# Patient Record
Sex: Female | Born: 1980 | Race: White | Hispanic: No | Marital: Married | Smoking: Never smoker
Health system: Southern US, Community
[De-identification: ages and names within clinical notes are randomized; demographics above are authoritative.]

## PROBLEM LIST (undated history)

## (undated) DIAGNOSIS — K219 Gastro-esophageal reflux disease without esophagitis: Secondary | ICD-10-CM

## (undated) DIAGNOSIS — IMO0001 Reserved for inherently not codable concepts without codable children: Secondary | ICD-10-CM

## (undated) HISTORY — PX: OTHER SURGICAL HISTORY: SHX169

---
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
## 2002-05-13 ENCOUNTER — Encounter: Payer: Self-pay | Admitting: Emergency Medicine

## 2002-05-13 ENCOUNTER — Emergency Department (HOSPITAL_COMMUNITY): Admission: EM | Admit: 2002-05-13 | Discharge: 2002-05-13 | Payer: Self-pay | Admitting: Emergency Medicine

## 2002-06-14 ENCOUNTER — Emergency Department (HOSPITAL_COMMUNITY): Admission: EM | Admit: 2002-06-14 | Discharge: 2002-06-14 | Payer: Self-pay | Admitting: Emergency Medicine

## 2003-04-18 ENCOUNTER — Emergency Department (HOSPITAL_COMMUNITY): Admission: EM | Admit: 2003-04-18 | Discharge: 2003-04-18 | Payer: Self-pay | Admitting: Emergency Medicine

## 2006-05-26 ENCOUNTER — Emergency Department (HOSPITAL_COMMUNITY): Admission: EM | Admit: 2006-05-26 | Discharge: 2006-05-26 | Payer: Self-pay | Admitting: Emergency Medicine

## 2006-05-28 ENCOUNTER — Emergency Department (HOSPITAL_COMMUNITY): Admission: EM | Admit: 2006-05-28 | Discharge: 2006-05-28 | Payer: Self-pay | Admitting: Emergency Medicine

## 2007-11-26 ENCOUNTER — Other Ambulatory Visit: Admission: RE | Admit: 2007-11-26 | Discharge: 2007-11-26 | Payer: Self-pay | Admitting: Obstetrics and Gynecology

## 2008-04-17 ENCOUNTER — Emergency Department (HOSPITAL_COMMUNITY): Admission: EM | Admit: 2008-04-17 | Discharge: 2008-04-17 | Payer: Self-pay | Admitting: Emergency Medicine

## 2008-09-22 ENCOUNTER — Emergency Department (HOSPITAL_COMMUNITY): Admission: EM | Admit: 2008-09-22 | Discharge: 2008-09-22 | Payer: Self-pay | Admitting: Emergency Medicine

## 2008-12-24 ENCOUNTER — Emergency Department (HOSPITAL_COMMUNITY): Admission: EM | Admit: 2008-12-24 | Discharge: 2008-12-24 | Payer: Self-pay | Admitting: Emergency Medicine

## 2009-12-21 ENCOUNTER — Ambulatory Visit: Payer: Self-pay | Admitting: Orthopedic Surgery

## 2009-12-21 DIAGNOSIS — D492 Neoplasm of unspecified behavior of bone, soft tissue, and skin: Secondary | ICD-10-CM

## 2009-12-22 ENCOUNTER — Encounter (INDEPENDENT_AMBULATORY_CARE_PROVIDER_SITE_OTHER): Payer: Self-pay | Admitting: *Deleted

## 2009-12-23 ENCOUNTER — Encounter: Payer: Self-pay | Admitting: Orthopedic Surgery

## 2010-01-17 ENCOUNTER — Telehealth: Payer: Self-pay | Admitting: Orthopedic Surgery

## 2010-01-20 ENCOUNTER — Encounter: Payer: Self-pay | Admitting: Orthopedic Surgery

## 2010-02-06 ENCOUNTER — Ambulatory Visit: Payer: Self-pay | Admitting: Orthopedic Surgery

## 2010-02-07 ENCOUNTER — Telehealth: Payer: Self-pay | Admitting: Orthopedic Surgery

## 2010-03-02 ENCOUNTER — Telehealth: Payer: Self-pay | Admitting: Orthopedic Surgery

## 2010-03-06 ENCOUNTER — Encounter: Payer: Self-pay | Admitting: Orthopedic Surgery

## 2010-05-07 ENCOUNTER — Encounter: Payer: Self-pay | Admitting: Orthopedic Surgery

## 2010-05-16 NOTE — Progress Notes (Signed)
Summary: MRI appointment changed.  Phone Note Outgoing Call   Call placed by: Waldon Reining,  January 17, 2010 9:57 AM Call placed to: Patient Action Taken: Appt scheduled Summary of Call: I called to let the patient know that I got her MRI approved at [facility]. They will call her to set up the appointment. She is to get a copy of her films to bring to Dr. Romeo Apple.

## 2010-05-16 NOTE — Assessment & Plan Note (Signed)
Summary: CONSULT/TREAT RT HAND CYST/NEED XRAY/CA MEDICAI/REF [place]/CAF   Vital Signs:  Patient profile:   30 year old female Height:      65 inches Weight:      242 pounds Pulse rate:   70 / minute Resp:     16 per minute  Vitals Entered By: Fuller Canada MD (December 21, 2009 3:47 PM)  Visit Type:  Initial Consult Referring Provider:  Dr. Sudie Bailey Primary Provider:  Dr. Waunita Sandstrom Giovanni  CC:  right hand.  History of Present Illness: I saw Sydney Stevens in the office today for an initial visit.  She is a 30 years old woman with the complaint of:  right hand cyst.  Xrays today.  Meds: Ventolin, Azmacort, Omeprazole.  This patient presents with a history of a cyst on the dorsum of her RIGHT hand for the last 6-12 months.  It is causing some aching in her hand and in her fingers on the RIGHT side.  Her pain seems to come and go she is currently not having any functional deficits.  She denies any injury.  No treatment up to this point.    Allergies (verified): No Known Drug Allergies  Past History:  Past Medical History: asthma acid reflux  Past Surgical History: DNC mouth cysts  Family History: FH of Cancer:  Family History of Arthritis Hx, family, asthma  Social History: Patient is married.  unemployed no smoking no alcohol 2 glasses per day of caffeine GED  Review of Systems Constitutional:  Denies weight loss, weight gain, fever, chills, and fatigue. Cardiovascular:  Complains of palpitations; denies chest pain, fainting, and murmurs. Respiratory:  Denies short of breath, wheezing, couch, tightness, pain on inspiration, and snoring . Gastrointestinal:  Complains of heartburn; denies nausea, vomiting, diarrhea, constipation, and blood in your stools. Genitourinary:  Denies frequency, urgency, difficulty urinating, painful urination, flank pain, and bleeding in urine. Neurologic:  Denies numbness, tingling, unsteady gait, dizziness, tremors, and  seizure. Musculoskeletal:  Denies joint pain, swelling, instability, stiffness, redness, heat, and muscle pain. Endocrine:  Denies excessive thirst, exessive urination, and heat or cold intolerance. Psychiatric:  Complains of anxiety; denies nervousness, depression, and hallucinations. Skin:  Denies changes in the skin, poor healing, rash, itching, and redness. HEENT:  Denies blurred or double vision, eye pain, redness, and watering. Immunology:  Denies seasonal allergies, sinus problems, and allergic to bee stings. Hemoatologic:  Denies easy bleeding and brusing.  Physical Exam  Additional Exam:  vital signs are normal orientation x3 normal mood normal inspection reveals mild tenderness over the palmar aspect of the hand with a small 2 x 2 centimeter soft subcutaneous mass which is mobile under the skin.  Range of motion is the hand is normal stability of the wrist is normal the skin overlying the lesion is normal the perfusion of the digit is normal there is no lymphadenopathy in the extremity   Impression & Recommendations:  Problem # 1:  NEOPLASMS UNSPEC NATURE BONE SOFT TISSUE&SKIN (ICD-239.2) Assessment New  radiographs include AP lateral oblique of the RIGHT hand  No bony abnormalities seen the lamina the bone is normal and the joint spaces are normal  Normal x-ray of the hand  This is an unknown mass present for over 6 months recommend an MRI to delineate the mass for diagnosis and treatment purposes  Orders: New Patient Level III (64332) Hand x-ray, minimum 3 views (73130)  Patient Instructions: 1)  MRI HAND  2)  RETURN FOR RESULTS

## 2010-05-16 NOTE — Letter (Signed)
Summary: History form  History form   Imported By: Jacklynn Ganong 12/22/2009 16:08:16  _____________________________________________________________________  External Attachment:    Type:   Image     Comment:   External Document

## 2010-05-16 NOTE — Progress Notes (Signed)
Summary: Referral to Dr. Tenishia Pea.  Phone Note Outgoing Call   Call placed by: Waldon Reining,  February 07, 2010 10:47 AM Call placed to: Specialist Action Taken: Information Sent Summary of Call: I faxed a referral to Dr. Calisa Pea.

## 2010-05-16 NOTE — Progress Notes (Signed)
Summary: Dr Sydney Stevens appt info per patient + MRI film question  Phone Note Call from Patient   Caller: Patient Summary of Call: Patient called to relay that she has been scheduled w/ Dr Bindi Stevens for appt tomorrow 03/03/10 @8 :45AM.  She states she had given [place] the MRI film(CD) from [facility] and doubts that she can get another copy by tomorrow.  Asking if we still have CD here?  Patient ph# [phone] Initial call taken by: Cammie Sickle,  March 02, 2010 9:36 AM  Follow-up for Phone Call        she can pick it up today Follow-up by: Ether Griffins,  March 02, 2010 9:39 AM  Additional Follow-up for Phone Call Additional follow up Details #1::        called patient and advised. Additional Follow-up by: Cammie Sickle,  March 02, 2010 10:02 AM

## 2010-05-16 NOTE — Assessment & Plan Note (Signed)
Summary: MRI RESULTS, BRINGING DISC/BSF   Visit Type:  Follow-up Referring Sydney Stevens:  Dr. Sudie Bailey Primary Sydney Stevens:  Dr. John Giovanni  CC:  right hand cyst.  History of Present Illness: This is a 30 year old female who comes in for followup visit after an MRI was ordered for her RIGHT hand for a cyst which is in the palm.  The MRI shows that she has a mass in the palm superficial to the flexor tendons which may be either a hemangioma or a vascular malformation.  Meds: Ventolin, Azmacort, Omeprazole.  Advised her to seek a hand specialist after I reviewed the report and interpreted the MRI   Allergies: No Known Drug Allergies   Impression & Recommendations:  Problem # 1:  NEOPLASMS UNSPEC NATURE BONE SOFT TISSUE&SKIN (ICD-239.2) The MRI was done at [facility] and it was reviewed with the report   Referral  Orders: Orthopedic Surgeon Referral (Ortho Surgeon) Est. Patient Level II [phone])  Patient Instructions: 1)  Referral to hand specialist, Dr. Ellouise Stevens   Orders Added: 1)  Orthopedic Surgeon Referral [Ortho Surgeon] 2)  Est. Patient Level II [60454]

## 2010-05-16 NOTE — Consult Note (Signed)
Summary: Consult note from Dr. Melvyn Novas  Consult note from Dr. Melvyn Novas   Imported By: Jacklynn Ganong 03/06/2010 12:20:29  _____________________________________________________________________  External Attachment:    Type:   Image     Comment:   External Document

## 2010-05-16 NOTE — Letter (Signed)
Summary: MRI insurance approval  MRI insurance approval   Imported By: Jacklynn Ganong 12/28/2009 15:45:53  _____________________________________________________________________  External Attachment:    Type:   Image     Comment:   External Document

## 2010-05-16 NOTE — Miscellaneous (Signed)
Summary: mri aph 12/27/09 at 930am  Clinical Lists Changes   medicaid precert [id number] exp 01/21/10, to bring disc for fu appt on 01/04/10 1115am.

## 2010-07-21 LAB — POCT CARDIAC MARKERS
CKMB, poc: <1 ng/mL — ABNORMAL LOW (ref 1.0–8.0)
Myoglobin, poc: 58.6 ng/mL (ref 12–200)

## 2010-07-21 LAB — D-DIMER, QUANTITATIVE: D-Dimer, Quant: 0.22 ug/mL-FEU (ref 0.00–0.48)

## 2011-05-31 ENCOUNTER — Other Ambulatory Visit: Payer: Self-pay | Admitting: Internal Medicine

## 2011-05-31 ENCOUNTER — Encounter (HOSPITAL_COMMUNITY): Payer: Self-pay | Admitting: *Deleted

## 2011-05-31 ENCOUNTER — Encounter (HOSPITAL_COMMUNITY)
Admission: EM | Disposition: A | Discharge status: Home / Self Care | Payer: Self-pay | Source: Home / Self Care | Attending: Emergency Medicine

## 2011-05-31 ENCOUNTER — Ambulatory Visit (HOSPITAL_COMMUNITY)
Admission: EM | Admit: 2011-05-31 | Discharge: 2011-05-31 | Disposition: A | Discharge status: Home / Self Care | Payer: Medicaid Other | Attending: Emergency Medicine | Admitting: Emergency Medicine

## 2011-05-31 DIAGNOSIS — K219 Gastro-esophageal reflux disease without esophagitis: Secondary | ICD-10-CM

## 2011-05-31 DIAGNOSIS — T18128A Food in esophagus causing other injury, initial encounter: Secondary | ICD-10-CM

## 2011-05-31 DIAGNOSIS — R1310 Dysphagia, unspecified: Secondary | ICD-10-CM | POA: Insufficient documentation

## 2011-05-31 HISTORY — DX: Gastro-esophageal reflux disease without esophagitis: K21.9

## 2011-05-31 HISTORY — PX: ESOPHAGOGASTRODUODENOSCOPY: SHX5428

## 2011-05-31 HISTORY — DX: Reserved for inherently not codable concepts without codable children: IMO0001

## 2011-05-31 SURGERY — EGD (ESOPHAGOGASTRODUODENOSCOPY)
Anesthesia: Moderate Sedation

## 2011-05-31 MED ORDER — MIDAZOLAM HCL 5 MG/5ML IJ SOLN
INTRAMUSCULAR | Status: DC | PRN
  Administered 2011-05-31: 1 mg via INTRAVENOUS
  Administered 2011-05-31: 2 mg via INTRAVENOUS
  Administered 2011-05-31: 1 mg via INTRAVENOUS

## 2011-05-31 MED ORDER — MEPERIDINE HCL 100 MG/ML IJ SOLN
INTRAMUSCULAR | Status: DC | PRN
  Administered 2011-05-31: 25 mg via INTRAVENOUS
  Administered 2011-05-31: 50 mg via INTRAVENOUS

## 2011-05-31 MED ORDER — BUTAMBEN-TETRACAINE-BENZOCAINE 2-2-14 % EX AERO
INHALATION_SPRAY | CUTANEOUS | Status: DC | PRN
  Administered 2011-05-31: 3 via TOPICAL

## 2011-05-31 MED ORDER — SODIUM CHLORIDE 0.45 % IV SOLN: INTRAVENOUS | Status: DC

## 2011-05-31 NOTE — Op Note (Signed)
[facility] [address] [place], [place]  [address]  ENDOSCOPY PROCEDURE REPORT  PATIENT:  Sydney Stevens, Sydney Stevens  MR#:  [id number] BIRTHDATE:  November 10, 1980, 30 yrs. old  GENDER:  female  ENDOSCOPIST:  R. Roetta Sessions, MD Caleen Essex Referred by:  Gareth Morgan, M.D., Dr. Colon Branch  PROCEDURE DATE:  05/31/2011 PROCEDURE:  EGD with Elease Hashimoto dilation followed by esophageal biopsy  INDICATIONS:       recent esophageal dysphagia in the setting of chronic GERD. Possible food impaction.  INFORMED CONSENT:   The risks, benefits, limitations, alternatives and imponderables have been discussed.  The potential for biopsy, esophogeal dilation, etc. have also been reviewed.  Questions have been answered.  All parties agreeable.  Please see the history and physical in the medical record for more information.  MEDICATIONS:Versed 4 mg IV and Demerol 75 mg IV in divided doses  DESCRIPTION OF PROCEDURE:   The OZ-[id number] ([id number]) endoscope was introduced through the mouth and advanced to the second portion of the duodenum without difficulty or limitations.  The mucosal surfaces were surveyed very carefully during advancement of the scope and upon withdrawal.  Retroflexion view of the proximal stomach and esophagogastric junction was performed.  <<PROCEDUREIMAGES>>  FINDINGS:  Normal esophagus. Normal stomach. Normal first and second portion of the duodenum  THERAPEUTIC / DIAGNOSTIC MANEUVERS PERFORMED:  A 54 French Maloney dilator was passed to full insertion with ease.  Subsequently, biopsies of the distal and mid esophagus were taken to rule out EoE.  COMPLICATIONS:   None  IMPRESSION:           Normal EGD. Status post [facility] dilation. Status post esophageal biopsy.  RECOMMENDATIONS:    Continue omeprazole 40 mg every day without fail. Gradually resume regular diet over the next 24 hours. Swallowing precautions reviewed. Follow up on pathology.  ______________________________ R.  Roetta Sessions, MD Caleen Essex  CC:  n. eSIGNED:   R. Roetta Sessions at 05/31/2011 04:28 PM  Vivi Ferns, [phone]

## 2011-05-31 NOTE — H&P (Signed)
Primary Care Physician:  Milana Obey, MD, MD Primary Gastroenterologist:  Dr. Jena Gauss  Pre-Procedure History & Physical: HPI:  Sydney Stevens is a 31 y.o. female here for evaluation of possible food impaction. Recently with swallowing meat on 2 different occasions and felt that the knee got stuck. Hasn't really been able to eat any solid food for the past 24 hours. Has been able to get liquids down. Saw Dr. Ayesha Rumpf. She called me. History of long-standing GERD intermittently compliant on omeprazole; he is supposed to take 40 mg daily but does not always take it on a daily basis for  Past Medical History  Diagnosis Date  . Asthma   . Reflux     Past Surgical History  Procedure Date  . Dilitation and cur     Prior to Admission medications   Medication Sig Start Date End Date Taking? Authorizing Provider  ibuprofen (ADVIL,MOTRIN) 800 MG tablet Take 800 mg by mouth every 6 (six) hours as needed. For pain   Yes Historical Provider, MD  omeprazole (PRILOSEC) 40 MG capsule Take 40 mg by mouth daily.   Yes Historical Provider, MD  albuterol (PROVENTIL HFA;VENTOLIN HFA) 108 (90 BASE) MCG/ACT inhaler Inhale 2 puffs into the lungs every 6 (six) hours as needed. For shortness of breath    Historical Provider, MD    Allergies as of 05/31/2011 - Review Complete 05/31/2011  Allergen Reaction Noted  . Vicodin (hydrocodone-acetaminophen)  05/31/2011    History reviewed. No pertinent family history.  History   Social History  . Marital Status: Married    Spouse Name: N/A    Number of Children: N/A  . Years of Education: N/A   Occupational History  . Not on file.   Social History Main Topics  . Smoking status: Never Smoker   . Smokeless tobacco: Not on file  . Alcohol Use: No  . Drug Use:   . Sexually Active: Yes    Birth Control/ Protection: None   Other Topics Concern  . Not on file   Social History Narrative  . No narrative on file    Review of Systems: See HPI,  otherwise negative ROS  Physical Exam: BP 139/82  Pulse 88  Temp(Src) 98.1 F (36.7 C) (Oral)  Resp 18  Ht 5\' 3"  (1.6 m)  Wt 250 lb (113.399 kg)  BMI 44.29 kg/m2  SpO2 100%  LMP 05/21/2011 General:   Alert,  Well-developed, well-nourished, pleasant and cooperative in NAD Skin:  Intact without significant lesions or rashes. Eyes:  Sclera clear, no icterus.   Conjunctiva pink. Ears:  Normal auditory acuity. Nose:  No deformity, discharge,  or lesions. Mouth:  No deformity or lesions. Neck:  Supple; no masses or thyromegaly. No significant cervical adenopathy. Lungs:  Clear throughout to auscultation.   No wheezes, crackles, or rhonchi. No acute distress. Heart:  Regular rate and rhythm; no murmurs, clicks, rubs,  or gallops. Abdomen: Non-distended, normal bowel sounds.  Soft and nontender without appreciable mass or hepatosplenomegaly.  Pulses:  Normal pulses noted. Extremities:  Without clubbing or edema.  Impression/Plan:   Pleasant 31 year old lady with long-standing GERD and recent symptoms consistent with food impaction.  Noncompliant with acid suppression therapy. Has been able to swallow liquids today. She was seen in the ED.  Urgent EGD now being offered. If she is not impacted and her upper GI tract is empty and there is a lesion treatable with dilation, we will embark on that course of treatment. However, if she is  impacted or her upper GI tract is not empty, we will take care of the impaction and bring her back for elective esophageal dilation as appropriate. This approach has been his explained to the patient. The risks, benefits, limitations, alternatives and imponderables have been reviewed with the patient. Potential for esophageal dilation, biopsy, etc. have also been reviewed.  Questions have been answered. All parties agreeable.

## 2011-05-31 NOTE — ED Provider Notes (Addendum)
History   Scribed for [employer]. Colon Branch, MD, the patient was seen in [facility]. The chart was scribed by Gilman Schmidt. The patients care was started at 3:11 PM.   CSN: [id number]  Arrival date & time 05/31/11  1402   First MD Initiated Contact with Patient 05/31/11 1508      Chief Complaint  Patient presents with  . Foreign Body    (Consider location/radiation/quality/duration/timing/severity/associated sxs/prior treatment) HPI Sydney Stevens is a 31 y.o. female with a history of Asthma and Acid Reflux who presents to the Emergency Department complaining of foreign body sensation onset two days prior. Notes she ate pork chops on Monday and steak on Tuesday. Reports uncomfortable feeling today while trying to swallow eggs and biscuit.Notes she is unable to get anything solid passed. Pt is able to get liquids down with no trouble. States she has had previous pain in throat but has never experienced a "blockage". There are no other associated symptoms and no other alleviating or aggravating factors.   PCP: Dr. Sudie Bailey  GI:  Past Medical History  Diagnosis Date  . Asthma   . Reflux     Past Surgical History  Procedure Date  . Dilitation and cur     No family history on file.  History  Substance Use Topics  . Smoking status: Never Smoker   . Smokeless tobacco: Not on file  . Alcohol Use: No    OB History    Grav Para Term Preterm Abortions TAB SAB Ect Mult Living                  Review of Systems  HENT:       Throat Pain  All other systems reviewed and are negative.  10 Systems reviewed and are negative for acute change except as noted in the HPI.  Allergies  Vicodin  Home Medications   Current Outpatient Rx  Name Route Sig Dispense Refill  . ALBUTEROL SULFATE HFA 108 (90 BASE) MCG/ACT IN AERS Inhalation Inhale 2 puffs into the lungs every 6 (six) hours as needed. For shortness of breath    . IBUPROFEN 800 MG PO TABS Oral Take 800 mg by mouth every 6 (six)  hours as needed. For pain    . OMEPRAZOLE 40 MG PO CPDR Oral Take 40 mg by mouth daily.      BP 139/82  Pulse 88  Temp(Src) 98.1 F (36.7 C) (Oral)  Resp 18  Ht 5\' 3"  (1.6 m)  Wt 250 lb (113.399 kg)  BMI 44.29 kg/m2  SpO2 100%  LMP 05/21/2011  Physical Exam  Constitutional: She is oriented to person, place, and time. She appears well-developed and well-nourished.  Non-toxic appearance. She does not have a sickly appearance.  HENT:  Head: Normocephalic and atraumatic.  Mouth/Throat: Oropharynx is clear and moist and mucous membranes are normal.       Clear voice   Eyes: Conjunctivae, EOM and lids are normal. Pupils are equal, round, and reactive to light. No scleral icterus.  Neck: Trachea normal and normal range of motion. Neck supple.  Cardiovascular: Normal rate, regular rhythm and normal heart sounds.   Pulmonary/Chest: Effort normal and breath sounds normal.  Abdominal: Soft. Normal appearance. There is no tenderness. There is no rebound, no guarding and no CVA tenderness.  Musculoskeletal: Normal range of motion.  Neurological: She is alert and oriented to person, place, and time. She has normal strength.  Skin: Skin is warm, dry and intact. No rash noted.  ED Course  Procedures (including critical care time)  DIAGNOSTIC STUDIES: Oxygen Saturation is 100% on room air, normal by my interpretation.    COORDINATION OF CARE: 3:11pm:  - Patient evaluated by ED physician, consult attempted with GI and plan for endoscopy discussed 3:35pm: Consult with GI Dr. Jena Gauss. 3:40pm: Pt transferred to Endo      MDM  Patient with difficulty swallowing since eating steak on Tuesday. She ate pork chops on Monday. Unable to swallow solids. Only able to swallow liquids with difficulty.Spoke with Dr. Jena Gauss, GI and patient will go to endoscopy for EGD.  I personally performed the services described in this documentation, which was scribed in my presence. The recorded information has  been reviewed and considered.  MDM Reviewed: nursing note and vitals          Nicoletta Dress. Colon Branch, MD 05/31/11 1708  Nicoletta Dress. Colon Branch, MD 05/31/11 1610

## 2011-05-31 NOTE — Discharge Instructions (Addendum)
EGD Discharge instructions Please read the instructions outlined below and refer to this sheet in the next few weeks. These discharge instructions provide you with general information on caring for yourself after you leave the hospital. Your doctor may also give you specific instructions. While your treatment has been planned according to the most current medical practices available, unavoidable complications occasionally occur. If you have any problems or questions after discharge, please call your doctor. ACTIVITY  You may resume your regular activity but move at a slower pace for the next 24 hours.   Take frequent rest periods for the next 24 hours.   Walking will help expel (get rid of) the air and reduce the bloated feeling in your abdomen.   No driving for 24 hours (because of the anesthesia (medicine) used during the test).   You may shower.   Do not sign any important legal documents or operate any machinery for 24 hours (because of the anesthesia used during the test).  NUTRITION  Drink plenty of fluids.   You may resume your normal diet.   Begin with a light meal and progress to your normal diet.   Avoid alcoholic beverages for 24 hours or as instructed by your caregiver.  MEDICATIONS  You may resume your normal medications unless your caregiver tells you otherwise.  WHAT YOU CAN EXPECT TODAY  You may experience abdominal discomfort such as a feeling of fullness or "gas" pains.  FOLLOW-UP  Your doctor will discuss the results of your test with you.  SEEK IMMEDIATE MEDICAL ATTENTION IF ANY OF THE FOLLOWING OCCUR:  Excessive nausea (feeling sick to your stomach) and/or vomiting.   Severe abdominal pain and distention (swelling).   Trouble swallowing.   Temperature over 101 F (37.8 C).   Rectal bleeding or vomiting of blood.     GERD information provided.  Gradually resume a regular diet over the next 24 hours.  Further recommendations to follow pending  review of pathology report.   Take omeprazole 40 mg daily without fail. Gastroesophageal Reflux Disease, Adult Gastroesophageal reflux disease (GERD) happens when acid from your stomach flows up into the esophagus. When acid comes in contact with the esophagus, the acid causes soreness (inflammation) in the esophagus. Over time, GERD may create small holes (ulcers) in the lining of the esophagus. CAUSES   Increased body weight. This puts pressure on the stomach, making acid rise from the stomach into the esophagus.   Smoking. This increases acid production in the stomach.   Drinking alcohol. This causes decreased pressure in the lower esophageal sphincter (valve or ring of muscle between the esophagus and stomach), allowing acid from the stomach into the esophagus.   Late evening meals and a full stomach. This increases pressure and acid production in the stomach.   A malformed lower esophageal sphincter.  Sometimes, no cause is found. SYMPTOMS   Burning pain in the lower part of the mid-chest behind the breastbone and in the mid-stomach area. This may occur twice a week or more often.   Trouble swallowing.   Sore throat.   Dry cough.   Asthma-like symptoms including chest tightness, shortness of breath, or wheezing.  DIAGNOSIS  Your caregiver may be able to diagnose GERD based on your symptoms. In some cases, X-rays and other tests may be done to check for complications or to check the condition of your stomach and esophagus. TREATMENT  Your caregiver may recommend over-the-counter or prescription medicines to help decrease acid production. Ask your caregiver  before starting or adding any new medicines.  HOME CARE INSTRUCTIONS   Change the factors that you can control. Ask your caregiver for guidance concerning weight loss, quitting smoking, and alcohol consumption.   Avoid foods and drinks that make your symptoms worse, such as:   Caffeine or alcoholic drinks.   Chocolate.     Peppermint or mint flavorings.   Garlic and onions.   Spicy foods.   Citrus fruits, such as oranges, lemons, or limes.   Tomato-based foods such as sauce, chili, salsa, and pizza.   Fried and fatty foods.   Avoid lying down for the 3 hours prior to your bedtime or prior to taking a nap.   Eat small, frequent meals instead of large meals.   Wear loose-fitting clothing. Do not wear anything tight around your waist that causes pressure on your stomach.   Raise the head of your bed 6 to 8 inches with wood blocks to help you sleep. Extra pillows will not help.   Only take over-the-counter or prescription medicines for pain, discomfort, or fever as directed by your caregiver.   Do not take aspirin, ibuprofen, or other nonsteroidal anti-inflammatory drugs (NSAIDs).  SEEK IMMEDIATE MEDICAL CARE IF:   You have pain in your arms, neck, jaw, teeth, or back.   Your pain increases or changes in intensity or duration.   You develop nausea, vomiting, or sweating (diaphoresis).   You develop shortness of breath, or you faint.   Your vomit is green, yellow, black, or looks like coffee grounds or blood.   Your stool is red, bloody, or black.  These symptoms could be signs of other problems, such as heart disease, gastric bleeding, or esophageal bleeding. MAKE SURE YOU:   Understand these instructions.   Will watch your condition.   Will get help right away if you are not doing well or get worse.  Document Released: 01/10/2005 Document Revised: 12/13/2010 Document Reviewed: 10/20/2010 [facility] Patient Information 2012 [place], [place].

## 2011-05-31 NOTE — ED Notes (Signed)
Foreign body sensation since Tuesday when ate steak.  Able to swallow flds, but feels unconfortable.  Worse at times

## 2011-06-06 ENCOUNTER — Encounter: Payer: Self-pay | Admitting: Internal Medicine

## 2011-06-08 ENCOUNTER — Encounter (HOSPITAL_COMMUNITY): Payer: Self-pay | Admitting: Internal Medicine

## 2011-08-08 ENCOUNTER — Ambulatory Visit (HOSPITAL_COMMUNITY)
Admission: RE | Admit: 2011-08-08 | Discharge: 2011-08-08 | Disposition: A | Discharge status: Home / Self Care | Payer: Medicaid Other | Source: Ambulatory Visit | Attending: Family Medicine | Admitting: Family Medicine

## 2011-08-08 ENCOUNTER — Other Ambulatory Visit (HOSPITAL_COMMUNITY): Payer: Self-pay | Admitting: Family Medicine

## 2011-08-08 DIAGNOSIS — R52 Pain, unspecified: Secondary | ICD-10-CM

## 2011-08-08 DIAGNOSIS — M79609 Pain in unspecified limb: Secondary | ICD-10-CM | POA: Insufficient documentation

## 2017-07-16 ENCOUNTER — Other Ambulatory Visit (HOSPITAL_COMMUNITY): Payer: Self-pay | Admitting: Family Medicine

## 2017-07-16 DIAGNOSIS — Z8271 Family history of polycystic kidney: Secondary | ICD-10-CM

## 2017-07-22 ENCOUNTER — Ambulatory Visit (HOSPITAL_COMMUNITY)
Admission: RE | Admit: 2017-07-22 | Discharge: 2017-07-22 | Disposition: A | Discharge status: Home / Self Care | Payer: BLUE CROSS/BLUE SHIELD | Source: Ambulatory Visit | Attending: Family Medicine | Admitting: Family Medicine

## 2017-07-22 DIAGNOSIS — D1771 Benign lipomatous neoplasm of kidney: Secondary | ICD-10-CM | POA: Diagnosis not present

## 2017-07-22 DIAGNOSIS — Z8271 Family history of polycystic kidney: Secondary | ICD-10-CM | POA: Diagnosis present

## 2017-07-22 DIAGNOSIS — N281 Cyst of kidney, acquired: Secondary | ICD-10-CM | POA: Diagnosis not present

## 2017-07-29 ENCOUNTER — Other Ambulatory Visit: Payer: Self-pay

## 2018-12-18 ENCOUNTER — Emergency Department (HOSPITAL_COMMUNITY): Payer: Medicaid Other

## 2018-12-18 ENCOUNTER — Other Ambulatory Visit: Payer: Self-pay

## 2018-12-18 ENCOUNTER — Inpatient Hospital Stay (HOSPITAL_COMMUNITY)
Admission: EM | Admit: 2018-12-18 | Discharge: 2018-12-20 | DRG: 760 | Disposition: A | Discharge status: Home / Self Care | Payer: Medicaid Other | Attending: Internal Medicine | Admitting: Internal Medicine

## 2018-12-18 ENCOUNTER — Encounter (HOSPITAL_COMMUNITY): Payer: Self-pay | Admitting: Emergency Medicine

## 2018-12-18 DIAGNOSIS — R509 Fever, unspecified: Secondary | ICD-10-CM

## 2018-12-18 DIAGNOSIS — R079 Chest pain, unspecified: Secondary | ICD-10-CM

## 2018-12-18 DIAGNOSIS — Z20828 Contact with and (suspected) exposure to other viral communicable diseases: Secondary | ICD-10-CM | POA: Diagnosis present

## 2018-12-18 DIAGNOSIS — J039 Acute tonsillitis, unspecified: Secondary | ICD-10-CM | POA: Diagnosis present

## 2018-12-18 DIAGNOSIS — Z8249 Family history of ischemic heart disease and other diseases of the circulatory system: Secondary | ICD-10-CM

## 2018-12-18 DIAGNOSIS — G43909 Migraine, unspecified, not intractable, without status migrainosus: Secondary | ICD-10-CM | POA: Diagnosis present

## 2018-12-18 DIAGNOSIS — R519 Headache, unspecified: Secondary | ICD-10-CM

## 2018-12-18 DIAGNOSIS — D649 Anemia, unspecified: Secondary | ICD-10-CM | POA: Diagnosis present

## 2018-12-18 DIAGNOSIS — N920 Excessive and frequent menstruation with regular cycle: Principal | ICD-10-CM | POA: Diagnosis present

## 2018-12-18 DIAGNOSIS — Z79899 Other long term (current) drug therapy: Secondary | ICD-10-CM

## 2018-12-18 DIAGNOSIS — E876 Hypokalemia: Secondary | ICD-10-CM | POA: Diagnosis present

## 2018-12-18 DIAGNOSIS — E280 Estrogen excess: Secondary | ICD-10-CM | POA: Diagnosis present

## 2018-12-18 DIAGNOSIS — G932 Benign intracranial hypertension: Secondary | ICD-10-CM | POA: Diagnosis present

## 2018-12-18 DIAGNOSIS — K219 Gastro-esophageal reflux disease without esophagitis: Secondary | ICD-10-CM | POA: Diagnosis present

## 2018-12-18 DIAGNOSIS — Z885 Allergy status to narcotic agent status: Secondary | ICD-10-CM

## 2018-12-18 DIAGNOSIS — D62 Acute posthemorrhagic anemia: Secondary | ICD-10-CM | POA: Diagnosis present

## 2018-12-18 DIAGNOSIS — R823 Hemoglobinuria: Secondary | ICD-10-CM | POA: Diagnosis present

## 2018-12-18 DIAGNOSIS — R809 Proteinuria, unspecified: Secondary | ICD-10-CM | POA: Diagnosis present

## 2018-12-18 DIAGNOSIS — Z6841 Body Mass Index (BMI) 40.0 and over, adult: Secondary | ICD-10-CM

## 2018-12-18 DIAGNOSIS — J45909 Unspecified asthma, uncomplicated: Secondary | ICD-10-CM | POA: Diagnosis present

## 2018-12-18 MED ORDER — ACETAMINOPHEN 325 MG PO TABS
650.0000 mg | ORAL_TABLET | Freq: Once | ORAL | Status: AC
  Administered 2018-12-18: 650 mg via ORAL
  Filled 2018-12-18: qty 2

## 2018-12-18 MED ORDER — METOCLOPRAMIDE HCL 5 MG/ML IJ SOLN
10.0000 mg | Freq: Once | INTRAMUSCULAR | Status: AC
  Administered 2018-12-18: 10 mg via INTRAVENOUS
  Filled 2018-12-18: qty 2

## 2018-12-18 MED ORDER — SODIUM CHLORIDE 0.9 % IV BOLUS
500.0000 mL | Freq: Once | INTRAVENOUS | Status: AC
  Administered 2018-12-18: 500 mL via INTRAVENOUS

## 2018-12-18 MED ORDER — LORAZEPAM 2 MG/ML IJ SOLN
1.0000 mg | Freq: Once | INTRAMUSCULAR | Status: DC
  Filled 2018-12-18: qty 1

## 2018-12-18 NOTE — ED Notes (Signed)
Patient retains fever and says the tylenol made her feel "drunk"

## 2018-12-18 NOTE — ED Triage Notes (Signed)
Patient complaining of headache since yesterday. Also complaining of blurry vision x 2 weeks.

## 2018-12-19 ENCOUNTER — Observation Stay (HOSPITAL_COMMUNITY): Payer: Medicaid Other

## 2018-12-19 ENCOUNTER — Encounter (HOSPITAL_COMMUNITY): Payer: Self-pay | Admitting: Internal Medicine

## 2018-12-19 DIAGNOSIS — K219 Gastro-esophageal reflux disease without esophagitis: Secondary | ICD-10-CM | POA: Diagnosis present

## 2018-12-19 DIAGNOSIS — N920 Excessive and frequent menstruation with regular cycle: Secondary | ICD-10-CM | POA: Diagnosis present

## 2018-12-19 DIAGNOSIS — D62 Acute posthemorrhagic anemia: Secondary | ICD-10-CM | POA: Diagnosis present

## 2018-12-19 DIAGNOSIS — G932 Benign intracranial hypertension: Secondary | ICD-10-CM | POA: Diagnosis present

## 2018-12-19 DIAGNOSIS — Z6841 Body Mass Index (BMI) 40.0 and over, adult: Secondary | ICD-10-CM | POA: Diagnosis not present

## 2018-12-19 DIAGNOSIS — G43909 Migraine, unspecified, not intractable, without status migrainosus: Secondary | ICD-10-CM | POA: Diagnosis present

## 2018-12-19 DIAGNOSIS — E66813 Obesity, class 3: Secondary | ICD-10-CM | POA: Diagnosis present

## 2018-12-19 DIAGNOSIS — E876 Hypokalemia: Secondary | ICD-10-CM | POA: Diagnosis present

## 2018-12-19 DIAGNOSIS — Z20828 Contact with and (suspected) exposure to other viral communicable diseases: Secondary | ICD-10-CM | POA: Diagnosis present

## 2018-12-19 DIAGNOSIS — Z79899 Other long term (current) drug therapy: Secondary | ICD-10-CM | POA: Diagnosis not present

## 2018-12-19 DIAGNOSIS — J039 Acute tonsillitis, unspecified: Secondary | ICD-10-CM | POA: Diagnosis present

## 2018-12-19 DIAGNOSIS — Z885 Allergy status to narcotic agent status: Secondary | ICD-10-CM | POA: Diagnosis not present

## 2018-12-19 DIAGNOSIS — D649 Anemia, unspecified: Secondary | ICD-10-CM | POA: Diagnosis present

## 2018-12-19 DIAGNOSIS — J45909 Unspecified asthma, uncomplicated: Secondary | ICD-10-CM | POA: Diagnosis present

## 2018-12-19 DIAGNOSIS — R823 Hemoglobinuria: Secondary | ICD-10-CM | POA: Diagnosis present

## 2018-12-19 DIAGNOSIS — R809 Proteinuria, unspecified: Secondary | ICD-10-CM | POA: Diagnosis present

## 2018-12-19 DIAGNOSIS — R519 Headache, unspecified: Secondary | ICD-10-CM | POA: Diagnosis present

## 2018-12-19 DIAGNOSIS — R509 Fever, unspecified: Secondary | ICD-10-CM | POA: Diagnosis present

## 2018-12-19 DIAGNOSIS — E280 Estrogen excess: Secondary | ICD-10-CM | POA: Diagnosis present

## 2018-12-19 DIAGNOSIS — R51 Headache: Secondary | ICD-10-CM | POA: Diagnosis present

## 2018-12-19 DIAGNOSIS — Z8249 Family history of ischemic heart disease and other diseases of the circulatory system: Secondary | ICD-10-CM | POA: Diagnosis not present

## 2018-12-19 HISTORY — DX: Morbid (severe) obesity due to excess calories: E66.01

## 2018-12-19 LAB — URINALYSIS, ROUTINE W REFLEX MICROSCOPIC
Bacteria, UA: NONE SEEN
Bilirubin Urine: NEGATIVE
Glucose, UA: NEGATIVE mg/dL
Ketones, ur: NEGATIVE mg/dL
Nitrite: NEGATIVE
Protein, ur: 30 mg/dL — AB
RBC / HPF: >50 RBC/hpf — ABNORMAL HIGH (ref 0–5)
Specific Gravity, Urine: 1.019 (ref 1.005–1.030)
pH: 5 (ref 5.0–8.0)

## 2018-12-19 LAB — BASIC METABOLIC PANEL
Anion gap: 10 (ref 5–15)
BUN: 6 mg/dL (ref 6–20)
CO2: 21 mmol/L — ABNORMAL LOW (ref 22–32)
Calcium: 8.7 mg/dL — ABNORMAL LOW (ref 8.9–10.3)
Chloride: 105 mmol/L (ref 98–111)
Creatinine, Ser: 0.61 mg/dL (ref 0.44–1.00)
GFR calc Af Amer: >60 mL/min (ref >60)
GFR calc non Af Amer: >60 mL/min (ref >60)
Glucose, Bld: 113 mg/dL — ABNORMAL HIGH (ref 70–99)
Potassium: 3.5 mmol/L (ref 3.5–5.1)
Sodium: 136 mmol/L (ref 135–145)

## 2018-12-19 LAB — CBC WITH DIFFERENTIAL/PLATELET
Abs Immature Granulocytes: 0.03 10*3/uL (ref 0.00–0.07)
Basophils Absolute: 0 10*3/uL (ref 0.0–0.1)
Basophils Relative: 1 %
Eosinophils Absolute: 0 10*3/uL (ref 0.0–0.5)
Eosinophils Relative: 0 %
HCT: 22.7 % — ABNORMAL LOW (ref 36.0–46.0)
Hemoglobin: 6.5 g/dL — CL (ref 12.0–15.0)
Immature Granulocytes: 0 %
Lymphocytes Relative: 10 %
Lymphs Abs: 0.8 10*3/uL (ref 0.7–4.0)
MCH: 19.4 pg — ABNORMAL LOW (ref 26.0–34.0)
MCHC: 28.6 g/dL — ABNORMAL LOW (ref 30.0–36.0)
MCV: 67.8 fL — ABNORMAL LOW (ref 80.0–100.0)
Monocytes Absolute: 0.6 10*3/uL (ref 0.1–1.0)
Monocytes Relative: 7 %
Neutro Abs: 7 10*3/uL (ref 1.7–7.7)
Neutrophils Relative %: 82 %
Platelets: 282 10*3/uL (ref 150–400)
RBC: 3.35 MIL/uL — ABNORMAL LOW (ref 3.87–5.11)
RDW: 16 % — ABNORMAL HIGH (ref 11.5–15.5)
WBC: 8.5 10*3/uL (ref 4.0–10.5)
nRBC: 0 % (ref 0.0–0.2)

## 2018-12-19 LAB — COMPREHENSIVE METABOLIC PANEL
ALT: 20 U/L (ref 0–44)
AST: 19 U/L (ref 15–41)
Albumin: 4.1 g/dL (ref 3.5–5.0)
Alkaline Phosphatase: 47 U/L (ref 38–126)
Anion gap: 9 (ref 5–15)
BUN: 8 mg/dL (ref 6–20)
CO2: 22 mmol/L (ref 22–32)
Calcium: 8.7 mg/dL — ABNORMAL LOW (ref 8.9–10.3)
Chloride: 103 mmol/L (ref 98–111)
Creatinine, Ser: 0.61 mg/dL (ref 0.44–1.00)
GFR calc Af Amer: >60 mL/min (ref >60)
GFR calc non Af Amer: >60 mL/min (ref >60)
Glucose, Bld: 128 mg/dL — ABNORMAL HIGH (ref 70–99)
Potassium: 3.3 mmol/L — ABNORMAL LOW (ref 3.5–5.1)
Sodium: 134 mmol/L — ABNORMAL LOW (ref 135–145)
Total Bilirubin: 0.5 mg/dL (ref 0.3–1.2)
Total Protein: 7.3 g/dL (ref 6.5–8.1)

## 2018-12-19 LAB — CBC
HCT: 26.4 % — ABNORMAL LOW (ref 36.0–46.0)
Hemoglobin: 8.1 g/dL — ABNORMAL LOW (ref 12.0–15.0)
MCH: 20.9 pg — ABNORMAL LOW (ref 26.0–34.0)
MCHC: 30.7 g/dL (ref 30.0–36.0)
MCV: 68 fL — ABNORMAL LOW (ref 80.0–100.0)
Platelets: 262 10*3/uL (ref 150–400)
RBC: 3.88 MIL/uL (ref 3.87–5.11)
RDW: 17.6 % — ABNORMAL HIGH (ref 11.5–15.5)
WBC: 9.5 10*3/uL (ref 4.0–10.5)
nRBC: 0 % (ref 0.0–0.2)

## 2018-12-19 LAB — SARS CORONAVIRUS 2 BY RT PCR (HOSPITAL ORDER, PERFORMED IN ~~LOC~~ HOSPITAL LAB): SARS Coronavirus 2: NEGATIVE

## 2018-12-19 LAB — PREPARE RBC (CROSSMATCH)

## 2018-12-19 LAB — IRON AND TIBC
Iron: 7 ug/dL — ABNORMAL LOW (ref 28–170)
Saturation Ratios: 1 % — ABNORMAL LOW (ref 10.4–31.8)
TIBC: 477 ug/dL — ABNORMAL HIGH (ref 250–450)
UIBC: 470 ug/dL

## 2018-12-19 LAB — RETICULOCYTES
Immature Retic Fract: 26.4 % — ABNORMAL HIGH (ref 2.3–15.9)
RBC.: 3.41 MIL/uL — ABNORMAL LOW (ref 3.87–5.11)
Retic Count, Absolute: 72.3 10*3/uL (ref 19.0–186.0)
Retic Ct Pct: 2.1 % (ref 0.4–3.1)

## 2018-12-19 LAB — PROTIME-INR
INR: 1 (ref 0.8–1.2)
Prothrombin Time: 13.5 seconds (ref 11.4–15.2)

## 2018-12-19 LAB — PREGNANCY, URINE: Preg Test, Ur: NEGATIVE

## 2018-12-19 LAB — GROUP A STREP BY PCR: Group A Strep by PCR: NOT DETECTED

## 2018-12-19 LAB — ABO/RH: ABO/RH(D): B POS

## 2018-12-19 LAB — FOLATE: Folate: 10.2 ng/mL (ref >5.9)

## 2018-12-19 LAB — PHOSPHORUS: Phosphorus: 3.4 mg/dL (ref 2.5–4.6)

## 2018-12-19 LAB — MAGNESIUM: Magnesium: 1.6 mg/dL — ABNORMAL LOW (ref 1.7–2.4)

## 2018-12-19 LAB — VITAMIN B12: Vitamin B-12: 266 pg/mL (ref 180–914)

## 2018-12-19 LAB — FERRITIN: Ferritin: 2 ng/mL — ABNORMAL LOW (ref 11–307)

## 2018-12-19 MED ORDER — ACETAMINOPHEN 325 MG PO TABS
650.0000 mg | ORAL_TABLET | Freq: Four times a day (QID) | ORAL | Status: DC | PRN
  Administered 2018-12-19 (×2): 650 mg via ORAL
  Filled 2018-12-19 (×2): qty 2

## 2018-12-19 MED ORDER — VITAMIN B-12 1000 MCG PO TABS
1000.0000 ug | ORAL_TABLET | Freq: Every day | ORAL | Status: DC
  Administered 2018-12-19 – 2018-12-20 (×2): 1000 ug via ORAL
  Filled 2018-12-19 (×2): qty 1

## 2018-12-19 MED ORDER — ACETAMINOPHEN 325 MG PO TABS
650.0000 mg | ORAL_TABLET | Freq: Once | ORAL | Status: AC
  Administered 2018-12-19: 650 mg via ORAL
  Filled 2018-12-19: qty 2

## 2018-12-19 MED ORDER — POTASSIUM CHLORIDE CRYS ER 20 MEQ PO TBCR
20.0000 meq | EXTENDED_RELEASE_TABLET | Freq: Once | ORAL | Status: AC
  Administered 2018-12-19: 20 meq via ORAL
  Filled 2018-12-19: qty 1

## 2018-12-19 MED ORDER — MENTHOL 3 MG MT LOZG
1.0000 | LOZENGE | Freq: Four times a day (QID) | OROMUCOSAL | Status: DC | PRN
  Administered 2018-12-19 – 2018-12-20 (×2): 3 mg via ORAL
  Filled 2018-12-19 (×2): qty 9

## 2018-12-19 MED ORDER — SODIUM CHLORIDE 0.9 % IV SOLN
510.0000 mg | Freq: Once | INTRAVENOUS | Status: AC
  Administered 2018-12-19: 510 mg via INTRAVENOUS
  Filled 2018-12-19: qty 510

## 2018-12-19 MED ORDER — PANTOPRAZOLE SODIUM 40 MG PO TBEC
40.0000 mg | DELAYED_RELEASE_TABLET | Freq: Every day | ORAL | Status: DC
  Administered 2018-12-19 – 2018-12-20 (×2): 40 mg via ORAL
  Filled 2018-12-19 (×2): qty 1

## 2018-12-19 MED ORDER — TOPIRAMATE 25 MG PO TABS
25.0000 mg | ORAL_TABLET | Freq: Every day | ORAL | Status: DC
  Filled 2018-12-19: qty 1

## 2018-12-19 MED ORDER — ALBUTEROL SULFATE (2.5 MG/3ML) 0.083% IN NEBU: 3.0000 mL | INHALATION_SOLUTION | Freq: Four times a day (QID) | RESPIRATORY_TRACT | Status: DC | PRN

## 2018-12-19 MED ORDER — SODIUM CHLORIDE 0.9 % IV SOLN: 10.0000 mL/h | Freq: Once | INTRAVENOUS | Status: DC

## 2018-12-19 MED ORDER — MAGNESIUM SULFATE 2 GM/50ML IV SOLN
2.0000 g | Freq: Once | INTRAVENOUS | Status: AC
  Administered 2018-12-19: 2 g via INTRAVENOUS
  Filled 2018-12-19: qty 50

## 2018-12-19 MED ORDER — KETOROLAC TROMETHAMINE 30 MG/ML IJ SOLN
30.0000 mg | Freq: Once | INTRAMUSCULAR | Status: AC
  Administered 2018-12-19: 30 mg via INTRAVENOUS
  Filled 2018-12-19: qty 1

## 2018-12-19 MED ORDER — PHENOL 1.4 % MT LIQD
1.0000 | OROMUCOSAL | Status: DC | PRN
  Administered 2018-12-19: 1 via OROMUCOSAL
  Filled 2018-12-19: qty 177

## 2018-12-19 NOTE — Progress Notes (Signed)
Patient seen and examined.  Admitted after midnight secondary to headaches and blurred vision; symptoms present for over 2 weeks now.  CT head negative for acute intracranial abnormalities.  Found to be anemic and what appears to be iron deficient anemia secondary to hypermenorrhea.  Patient expressed feeling increasingly fatigued, more sleepy despite good night rest, palpitations and dizziness.  Please refer to H&P written by Dr. Olevia Bowens on 12/19/2018 for further info/details on admission.  Plan: -Follow Gyn ultrasound -Patient will be transfused -Repeat CBC in a.m. -start B12 daily supplementation -Start patient on Topamax, as it is a component of concerns in her history for idiopathic intracranial hypertension; patient declines lumbar puncture. -will also give IV iron per pharmacy   Barton Dubois MD [phone]

## 2018-12-19 NOTE — Progress Notes (Signed)
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 Vicodin [Hydrocodone-Acetaminophen]     nausea    Patient Measurements: Height: 5\' 2"  (157.5 cm) Weight: 246 lb (111.6 kg) IBW/kg (Calculated) : 50.1   Vital Signs: Temp: 100.3 F (37.9 C) (09/04 0736) Temp Source: Oral (09/04 0736) BP: 124/64 (09/04 0815) Pulse Rate: 104 (09/04 0815) Intake/Output from previous day: 09/03 0701 - 09/04 0700 In: 1617 [Blood:1117; IV Piggyback:500] Out: -  Intake/Output from this shift: Total I/O In: 554 [Blood:554] Out: -   Labs: Recent Labs    12/18/18 2331  WBC 8.5  HGB 6.5*  HCT 22.7*  PLT 282  CREATININE 0.61  MG 1.6*  PHOS 3.4  ALBUMIN 4.1  PROT 7.3  AST 19  ALT 20  ALKPHOS 47  BILITOT 0.5   Estimated Creatinine Clearance: 111.3 mL/min (by C-G formula based on SCr of 0.61 mg/dL).   Microbiology: Recent Results (from the past 720 hour(s))  SARS Coronavirus 2 [facility] order, Performed in [facility] hospital lab) Nasopharyngeal Nasopharyngeal Swab     Status: None   Collection Time: 12/18/18 11:19 PM   Specimen: Nasopharyngeal Swab  Result Value Ref Range Status   SARS Coronavirus 2 NEGATIVE NEGATIVE Final    Comment: (NOTE) If result is NEGATIVE SARS-CoV-2 target nucleic acids are NOT DETECTED. The SARS-CoV-2 RNA is generally detectable in upper and lower  respiratory specimens during the acute phase of infection. The lowest  concentration of SARS-CoV-2 viral copies this assay can detect is 250  copies / mL. A negative result does not preclude SARS-CoV-2 infection  and should not be used as the sole basis for treatment or other  patient management decisions.  A negative result may occur with  improper specimen collection / handling, submission of specimen other  than nasopharyngeal swab, presence of viral mutation(s) within the  areas targeted  by this assay, and inadequate number of viral copies  (<250 copies / mL). A negative result must be combined with clinical  observations, patient history, and epidemiological information. If result is POSITIVE SARS-CoV-2 target nucleic acids are DETECTED. The SARS-CoV-2 RNA is generally detectable in upper and lower  respiratory specimens dur ing the acute phase of infection.  Positive  results are indicative of active infection with SARS-CoV-2.  Clinical  correlation with patient history and other diagnostic information is  necessary to determine patient infection status.  Positive results do  not rule out bacterial infection or co-infection with other viruses. If result is PRESUMPTIVE POSTIVE SARS-CoV-2 nucleic acids MAY BE PRESENT.   A presumptive positive result was obtained on the submitted specimen  and confirmed on repeat testing.  While 2019 novel coronavirus  (SARS-CoV-2) nucleic acids may be present in the submitted sample  additional confirmatory testing may be necessary for epidemiological  and / or clinical management purposes  to differentiate between  SARS-CoV-2 and other Sarbecovirus currently known to infect humans.  If clinically indicated additional testing with an alternate test  methodology [phone]) is advised. The SARS-CoV-2 RNA is generally  detectable in upper and lower respiratory sp ecimens during the acute  phase of infection. The expected result is Negative. Fact Sheet for Patients:  StrictlyIdeas.no Fact Sheet for Healthcare Providers: BankingDealers.co.za This test is not yet approved or cleared by the [place] FDA and has been authorized for detection and/or diagnosis of SARS-CoV-2 by FDA under an Emergency Use Authorization (  EUA).  This EUA will remain in effect (meaning this test can be used) for the duration of the COVID-19 declaration under Section 564(b)(1) of the Act, 21 U.S.C. section  360bbb-3(b)(1), unless the authorization is terminated or revoked sooner. Performed at [facility], [address], [place], [place] [address]     Medical History: Past Medical History:  Diagnosis Date  . Asthma   . Obesity, Class III, BMI 40-49.9 (morbid obesity) ([place]) 12/19/2018  . Reflux     Medications:  (Not in a hospital admission)   Assessment: Pharmacy consulted to dose IV iron for patient with iron deficient anemia secondary to hypermenorrhea.  Patient's hemoglobin on admission 6.5 and iron 7.  Plan:  Feraheme 510 mg IV x 1 dose. May repeat dose in 3-8 days if needed. Monitor H&H and patient improvement.  Revonda Standard Eduardo Wurth 12/19/2018,8:38 AM

## 2018-12-19 NOTE — ED Notes (Signed)
Patient given crackers per MD approval.

## 2018-12-19 NOTE — ED Notes (Signed)
CRITICAL VALUE ALERT  Critical Value:  Hgb 6.5  Date & Time Notied:  12/19/2018 0005  Provider Notified: Dr. Kathrynn Humble  Orders Received/Actions taken: see chart

## 2018-12-19 NOTE — ED Notes (Signed)
Pt transported to US

## 2018-12-19 NOTE — ED Notes (Signed)
ED TO INPATIENT HANDOFF REPORT  ED Nurse Name and Phone #: Loden Laurent,rn [phone]  S Name/Age/Gender Sydney Stevens 38 y.o. female Room/Bed: APA05/APA05  Code Status   Code Status: Full Code  Home/SNF/Other Home Patient oriented to: self, place, time and situation Is this baseline? Yes   Triage Complete: Triage complete  Chief Complaint Head Pressure (2 days)  Triage Note Patient complaining of headache since yesterday. Also complaining of blurry vision x 2 weeks.    Allergies Allergies  Allergen Reactions  . Vicodin [Hydrocodone-Acetaminophen]     nausea    Level of Care/Admitting Diagnosis ED Disposition    ED Disposition Condition [facility] Area: [facility] [id number]  Level of Care: Telemetry [5]  Covid Evaluation: Asymptomatic Screening Protocol (No Symptoms)  Diagnosis: Symptomatic anemia [id number]  Admitting Physician: Reubin Milan [id number]  Attending Physician: Reubin Milan [id number]  PT Class (Do Not Modify): Observation [104]  PT Acc Code (Do Not Modify): Observation [10022]       B Medical/Surgery History Past Medical History:  Diagnosis Date  . Asthma   . Obesity, Class III, BMI 40-49.9 (morbid obesity) ([place]) 12/19/2018  . Reflux    Past Surgical History:  Procedure Laterality Date  . dilitation and cur    . ESOPHAGOGASTRODUODENOSCOPY  05/31/2011   Procedure: ESOPHAGOGASTRODUODENOSCOPY (EGD);  Surgeon: Daneil Dolin, MD;  Location: [facility];  Service: Endoscopy;  Laterality: N/A;     A IV Location/Drains/Wounds Patient Lines/Drains/Airways Status   Active Line/Drains/Airways    Name:   Placement date:   Placement time:   Site:   Days:   Peripheral IV 12/18/18 Right Antecubital   12/18/18    2333    Antecubital   1          Intake/Output Last 24 hours  Intake/Output Summary (Last 24 hours) at 12/19/2018 1200 Last data filed at 12/19/2018 0736 Gross per 24 hour  Intake 2171 ml  Output -  Net  2171 ml    Labs/Imaging Results for orders placed or performed during the hospital encounter of 12/18/18 (from the past 48 hour(s))  SARS Coronavirus 2 [facility] order, Performed in [facility] hospital lab) Nasopharyngeal Nasopharyngeal Swab     Status: None   Collection Time: 12/18/18 11:19 PM   Specimen: Nasopharyngeal Swab  Result Value Ref Range   SARS Coronavirus 2 NEGATIVE NEGATIVE    Comment: (NOTE) If result is NEGATIVE SARS-CoV-2 target nucleic acids are NOT DETECTED. The SARS-CoV-2 RNA is generally detectable in upper and lower  respiratory specimens during the acute phase of infection. The lowest  concentration of SARS-CoV-2 viral copies this assay can detect is 250  copies / mL. A negative result does not preclude SARS-CoV-2 infection  and should not be used as the sole basis for treatment or other  patient management decisions.  A negative result may occur with  improper specimen collection / handling, submission of specimen other  than nasopharyngeal swab, presence of viral mutation(s) within the  areas targeted by this assay, and inadequate number of viral copies  (<250 copies / mL). A negative result must be combined with clinical  observations, patient history, and epidemiological information. If result is POSITIVE SARS-CoV-2 target nucleic acids are DETECTED. The SARS-CoV-2 RNA is generally detectable in upper and lower  respiratory specimens dur ing the acute phase of infection.  Positive  results are indicative of active infection with SARS-CoV-2.  Clinical  correlation with patient history and other  diagnostic information is  necessary to determine patient infection status.  Positive results do  not rule out bacterial infection or co-infection with other viruses. If result is PRESUMPTIVE POSTIVE SARS-CoV-2 nucleic acids MAY BE PRESENT.   A presumptive positive result was obtained on the submitted specimen  and confirmed on repeat testing.  While 2019 novel  coronavirus  (SARS-CoV-2) nucleic acids may be present in the submitted sample  additional confirmatory testing may be necessary for epidemiological  and / or clinical management purposes  to differentiate between  SARS-CoV-2 and other Sarbecovirus currently known to infect humans.  If clinically indicated additional testing with an alternate test  methodology [phone]) is advised. The SARS-CoV-2 RNA is generally  detectable in upper and lower respiratory sp ecimens during the acute  phase of infection. The expected result is Negative. Fact Sheet for Patients:  StrictlyIdeas.no Fact Sheet for Healthcare Providers: BankingDealers.co.za This test is not yet approved or cleared by the [place] FDA and has been authorized for detection and/or diagnosis of SARS-CoV-2 by FDA under an Emergency Use Authorization (EUA).  This EUA will remain in effect (meaning this test can be used) for the duration of the COVID-19 declaration under Section 564(b)(1) of the Act, 21 U.S.C. section 360bbb-3(b)(1), unless the authorization is terminated or revoked sooner. Performed at [facility], [address], [place], [place] [address]   Comprehensive metabolic panel     Status: Abnormal   Collection Time: 12/18/18 11:31 PM  Result Value Ref Range   Sodium 134 (L) 135 - 145 mmol/L   Potassium 3.3 (L) 3.5 - 5.1 mmol/L   Chloride 103 98 - 111 mmol/L   CO2 22 22 - 32 mmol/L   Glucose, Bld 128 (H) 70 - 99 mg/dL   BUN 8 6 - 20 mg/dL   Creatinine, Ser 0.61 0.44 - 1.00 mg/dL   Calcium 8.7 (L) 8.9 - 10.3 mg/dL   Total Protein 7.3 6.5 - 8.1 g/dL   Albumin 4.1 3.5 - 5.0 g/dL   AST 19 15 - 41 U/L   ALT 20 0 - 44 U/L   Alkaline Phosphatase 47 38 - 126 U/L   Total Bilirubin 0.5 0.3 - 1.2 mg/dL   GFR calc non Af Amer >60 >60 mL/min   GFR calc Af Amer >60 >60 mL/min   Anion gap 9 5 - 15    Comment: Performed at [facility], [address] [place], [place]  [address]  CBC with Differential     Status: Abnormal   Collection Time: 12/18/18 11:31 PM  Result Value Ref Range   WBC 8.5 4.0 - 10.5 K/uL   RBC 3.35 (L) 3.87 - 5.11 MIL/uL   Hemoglobin 6.5 (LL) 12.0 - 15.0 g/dL    Comment: Reticulocyte Hemoglobin testing may be clinically indicated, consider ordering this additional test [id number] THIS CRITICAL RESULT HAS VERIFIED AND BEEN CALLED TO M DOSS,RN BY MARIE KELLY ON 09 04 2020 AT 0004, AND HAS BEEN READ BACK. REPEATED TO VERIFY.    HCT 22.7 (L) 36.0 - 46.0 %   MCV 67.8 (L) 80.0 - 100.0 fL   MCH 19.4 (L) 26.0 - 34.0 pg   MCHC 28.6 (L) 30.0 - 36.0 g/dL   RDW 16.0 (H) 11.5 - 15.5 %   Platelets 282 150 - 400 K/uL   nRBC 0.0 0.0 - 0.2 %   Neutrophils Relative % 82 %   Neutro Abs 7.0 1.7 - 7.7 K/uL   Lymphocytes Relative 10 %  Lymphs Abs 0.8 0.7 - 4.0 K/uL   Monocytes Relative 7 %   Monocytes Absolute 0.6 0.1 - 1.0 K/uL   Eosinophils Relative 0 %   Eosinophils Absolute 0.0 0.0 - 0.5 K/uL   Basophils Relative 1 %   Basophils Absolute 0.0 0.0 - 0.1 K/uL   Immature Granulocytes 0 %   Abs Immature Granulocytes 0.03 0.00 - 0.07 K/uL    Comment: Performed at [facility], [address] [place], [place] [address]  Type and screen     Status: None (Preliminary result)   Collection Time: 12/18/18 11:31 PM  Result Value Ref Range   ABO/RH(D) B POS    Antibody Screen NEG    Sample Expiration 12/21/2018,2359    Unit Number [id number]    Blood Component Type RBC LR PHER1    Unit division 00    Status of Unit ISSUED    Transfusion Status OK TO TRANSFUSE    Crossmatch Result      Compatible Performed at [facility], [address], [place], [place] [address]    Unit Number [id number]    Blood Component Type RBC LR PHER2    Unit division 00    Status of Unit ISSUED    Transfusion Status OK TO TRANSFUSE    Crossmatch Result Compatible   Protime-INR     Status: None   Collection Time: 12/18/18 11:31 PM  Result Value Ref Range    Prothrombin Time 13.5 11.4 - 15.2 seconds   INR 1.0 0.8 - 1.2    Comment: (NOTE) INR goal varies based on device and disease states. Performed at [facility], [address], [place], [place] [address]   Prepare RBC     Status: None   Collection Time: 12/18/18 11:31 PM  Result Value Ref Range   Order Confirmation      ORDER PROCESSED BY BLOOD BANK Performed at [facility], [address], [place], [place] [address]   ABO/Rh     Status: None   Collection Time: 12/18/18 11:31 PM  Result Value Ref Range   ABO/RH(D)      B POS Performed at [facility], [address] [place], [place] [address]   Magnesium     Status: Abnormal   Collection Time: 12/18/18 11:31 PM  Result Value Ref Range   Magnesium 1.6 (L) 1.7 - 2.4 mg/dL    Comment: Performed at [facility], [address] [place], [place] [address]  Phosphorus     Status: None   Collection Time: 12/18/18 11:31 PM  Result Value Ref Range   Phosphorus 3.4 2.5 - 4.6 mg/dL    Comment: Performed at [facility], [address] [place], [place] [address]  Vitamin B12     Status: None   Collection Time: 12/18/18 11:31 PM  Result Value Ref Range   Vitamin B-12 266 180 - 914 pg/mL    Comment: (NOTE) This assay is not validated for testing neonatal or myeloproliferative syndrome specimens for Vitamin B12 levels. Performed at [facility], [address], [place], [place] [address]   Iron and TIBC     Status: Abnormal   Collection Time: 12/18/18 11:31 PM  Result Value Ref Range   Iron 7 (L) 28 - 170 ug/dL   TIBC 477 (H) 250 - 450 ug/dL   Saturation Ratios 1 (L) 10.4 - 31.8 %   UIBC 470 ug/dL    Comment: Performed at [facility], [address], [place], [place] [address]  Ferritin  Status: Abnormal   Collection Time: 12/18/18 11:31 PM  Result Value Ref Range   Ferritin 2 (L) 11 - 307 ng/mL    Comment: Performed at [facility], [address], [place], [place] [address]  Reticulocytes     Status: Abnormal   Collection Time:  12/18/18 11:31 PM  Result Value Ref Range   Retic Ct Pct 2.1 0.4 - 3.1 %   RBC. 3.41 (L) 3.87 - 5.11 MIL/uL   Retic Count, Absolute 72.3 19.0 - 186.0 K/uL   Immature Retic Fract 26.4 (H) 2.3 - 15.9 %    Comment: Performed at [facility], [address] [place], [place] [address]  Urinalysis, Routine w reflex microscopic     Status: Abnormal   Collection Time: 12/19/18 12:15 AM  Result Value Ref Range   Color, Urine YELLOW YELLOW   APPearance HAZY (A) CLEAR   Specific Gravity, Urine 1.019 1.005 - 1.030   pH 5.0 5.0 - 8.0   Glucose, UA NEGATIVE NEGATIVE mg/dL   Hgb urine dipstick LARGE (A) NEGATIVE   Bilirubin Urine NEGATIVE NEGATIVE   Ketones, ur NEGATIVE NEGATIVE mg/dL   Protein, ur 30 (A) NEGATIVE mg/dL   Nitrite NEGATIVE NEGATIVE   Leukocytes,Ua SMALL (A) NEGATIVE   RBC / HPF >50 (H) 0 - 5 RBC/hpf   WBC, UA 6-10 0 - 5 WBC/hpf   Bacteria, UA NONE SEEN NONE SEEN   Squamous Epithelial / LPF 0-5 0 - 5   Mucus PRESENT     Comment: Performed at [facility], [address], [place], [place] [address]  Pregnancy, urine     Status: None   Collection Time: 12/19/18 12:15 AM  Result Value Ref Range   Preg Test, Ur NEGATIVE NEGATIVE    Comment:        THE SENSITIVITY OF THIS METHODOLOGY IS >20 mIU/mL. Performed at [facility], [address], [place], [place] [address]   Folate     Status: None   Collection Time: 12/19/18  3:30 AM  Result Value Ref Range   Folate 10.2 >5.9 ng/mL    Comment: Performed at [facility], [address], [place], [place] [address]  CBC     Status: Abnormal   Collection Time: 12/19/18  8:37 AM  Result Value Ref Range   WBC 9.5 4.0 - 10.5 K/uL   RBC 3.88 3.87 - 5.11 MIL/uL   Hemoglobin 8.1 (L) 12.0 - 15.0 g/dL    Comment: POST TRANSFUSION SPECIMEN Reticulocyte Hemoglobin testing may be clinically indicated, consider ordering this additional test [id number]    HCT 26.4 (L) 36.0 - 46.0 %   MCV 68.0 (L) 80.0 - 100.0 fL   MCH 20.9 (L) 26.0 - 34.0 pg    MCHC 30.7 30.0 - 36.0 g/dL   RDW 17.6 (H) 11.5 - 15.5 %   Platelets 262 150 - 400 K/uL    Comment: PLATELET COUNT CONFIRMED BY SMEAR SPECIMEN CHECKED FOR CLOTS LARGE PLATELETS    nRBC 0.0 0.0 - 0.2 %    Comment: Performed at [facility], [address], [place], [place] [id number]  Basic metabolic panel     Status: Abnormal   Collection Time: 12/19/18  8:37 AM  Result Value Ref Range   Sodium 136 135 - 145 mmol/L   Potassium 3.5 3.5 - 5.1 mmol/L   Chloride 105 98 - 111 mmol/L   CO2 21 (L) 22 - 32 mmol/L   Glucose, Bld 113 (H) 70 - 99 mg/dL   BUN  6 6 - 20 mg/dL   Creatinine, Ser 0.61 0.44 - 1.00 mg/dL   Calcium 8.7 (L) 8.9 - 10.3 mg/dL   GFR calc non Af Amer >60 >60 mL/min   GFR calc Af Amer >60 >60 mL/min   Anion gap 10 5 - 15    Comment: Performed at [facility], [address], [place], [place] [address]  Group A Strep by PCR     Status: None   Collection Time: 12/19/18  8:43 AM   Specimen: Throat; Sterile Swab  Result Value Ref Range   Group A Strep by PCR NOT DETECTED NOT DETECTED    Comment: Performed at [facility], [address], [place], [place] [address]   Dg Chest 2 View  Result Date: 12/19/2018 CLINICAL DATA:  Left-sided chest pain radiating to the back. EXAM: CHEST - 2 VIEW COMPARISON:  Chest x-ray dated December 25, 2008. FINDINGS: The heart is at the upper limits of normal in size. Normal mediastinal contours. Normal pulmonary vascularity. No focal consolidation, pleural effusion, or pneumothorax. No acute osseous abnormality. IMPRESSION: No active cardiopulmonary disease. Electronically Signed   By: Titus Dubin M.D.   On: 12/19/2018 09:40   Ct Head Wo Contrast  Result Date: 12/19/2018 CLINICAL DATA:  Ataxia, stroke suspected, headache for 1 day and blurry vision for 2 weeks EXAM: CT HEAD WITHOUT CONTRAST TECHNIQUE: Contiguous axial images were obtained from the base of the skull through the vertex without intravenous contrast. COMPARISON:  None. FINDINGS:  Brain: No evidence of acute infarction, hemorrhage, hydrocephalus, extra-axial collection or mass lesion/mass effect. Vascular: No hyperdense vessel or unexpected calcification. Skull: No calvarial fracture or suspicious osseous lesion. No scalp swelling or hematoma. Sinuses/Orbits: Paranasal sinuses and mastoid air cells are predominantly clear. Included orbital structures are unremarkable. Other: None. IMPRESSION: Normal noncontrast head CT. Electronically Signed   By: Lovena Le M.D.   On: 12/19/2018 01:19   US Pelvic Complete With Transvaginal  Result Date: 12/19/2018 CLINICAL DATA:  Hypermenorrhea, menorrhagia for months EXAM: TRANSABDOMINAL AND TRANSVAGINAL ULTRASOUND OF PELVIS TECHNIQUE: Both transabdominal and transvaginal ultrasound examinations of the pelvis were performed. Transabdominal technique was performed for global imaging of the pelvis including uterus, ovaries, adnexal regions, and pelvic cul-de-sac. It was necessary to proceed with endovaginal exam following the transabdominal exam to visualize the uterus, endometrium, and ovaries. COMPARISON:  None FINDINGS: Uterus Measurements: 9.2 x 4.7 x 6.4 cm = volume: 145 mL. Anteverted. Heterogeneous myometrium. Small cystic collection identified at the mid to lower uterus, 16 x 11 x 13 mm, question small degenerated leiomyoma, likely extending submucosal. No additional focal uterine masses Endometrium Thickness: 11 mm.  No endometrial fluid. Right ovary Measurements: 2.9 x 2.1 x 3.4 cm = volume: 10.7 mL. Suboptimally visualized due to bowel. No gross mass. Left ovary Measurements: 3.5 x 1.8 x 2.2 cm = volume: 6.9 mL. Normal morphology without mass Other findings No free pelvic fluid or adnexal masses. IMPRESSION: Probable small degenerated submucosal leiomyoma at mid to lower uterine segments, 16 mm greatest size. Otherwise unremarkable exam. Electronically Signed   By: Lavonia Dana M.D.   On: 12/19/2018 08:37    Pending Labs Unresulted Labs  (From admission, onward)    Start     Ordered   12/20/18 0500  HIV antibody (Routine Testing)  Tomorrow morning,   R     12/19/18 0222   12/20/18 0500  CBC  Tomorrow morning,   R     12/19/18 [id number]  Vitals/Pain Today's Vitals   12/19/18 0942 12/19/18 1051 12/19/18 1052 12/19/18 1055  BP:    (!) 112/53  Pulse:    (!) 101  Resp:    20  Temp: 99.1 F (37.3 C)   99.1 F (37.3 C)  TempSrc: Oral   Oral  SpO2:    97%  Weight:      Height:      PainSc:  0-No pain 0-No pain     Isolation Precautions No active isolations  Medications Medications  LORazepam (ATIVAN) injection 1 mg (1 mg Intravenous Refused 12/18/18 2353)  0.9 %  sodium chloride infusion (has no administration in time range)  topiramate (TOPAMAX) tablet 25 mg (has no administration in time range)  vitamin B-12 (CYANOCOBALAMIN) tablet 1,000 mcg (has no administration in time range)  menthol-cetylpyridinium (CEPACOL) lozenge 3 mg (has no administration in time range)  acetaminophen (TYLENOL) tablet 650 mg (650 mg Oral Given 12/18/18 2116)  metoCLOPramide (REGLAN) injection 10 mg (10 mg Intravenous Given 12/18/18 2336)  sodium chloride 0.9 % bolus 500 mL (0 mLs Intravenous Stopped 12/19/18 0052)  acetaminophen (TYLENOL) tablet 650 mg (650 mg Oral Given 12/19/18 0249)  magnesium sulfate IVPB 2 g 50 mL (0 g Intravenous Stopped 12/19/18 0944)  potassium chloride SA (K-DUR) CR tablet 20 mEq (20 mEq Oral Given 12/19/18 0504)  acetaminophen (TYLENOL) tablet 650 mg (650 mg Oral Given 12/19/18 0835)  ferumoxytol (FERAHEME) 510 mg in sodium chloride 0.9 % 100 mL IVPB (510 mg Intravenous New Bag/Given 12/19/18 0940)    Mobility walks Low fall risk   Focused Assessments    R Recommendations: See Admitting Provider Note  Report given to:   Additional Notes:

## 2018-12-19 NOTE — ED Provider Notes (Addendum)
[facility] EMERGENCY DEPARTMENT Provider Note   CSN: [id number] Arrival date & time: 12/18/18  2058     History   Chief Complaint Chief Complaint  Patient presents with  . Headache    HPI Sydney Stevens is a 38 y.o. female.     HPI   38 year old female comes in a chief complaint of headache.  Patient reports that over the last 2 days she has been having intermittent episodes of headaches, dizziness.  The headaches are described as pressure type headache that are generalized -most pronounced over the front and back of her head.  There is no associated neck pain or stiffness.  The dizziness is described as feeling " drunk" and lightheaded.  Symptoms are worse when she walks or when she turns her head.  Occasionally she has noted that her vision will get blurry and when she turns her head a certain way.  She is noted to have a low-grade fever in the ED, but she denied knowing that she was having fevers.  Review of system is negative for any vomiting, chills, neck stiffness, rash, focal numbness or weakness.  Of note, patient also indicates that she is been having heavy bleeding for the last several months.  She has seen her PCP for it, and was advised to come to the ER but she had declined.  She denies any burning with urination, blood in the urine.  Past Medical History:  Diagnosis Date  . Asthma   . Obesity, Class III, BMI 40-49.9 (morbid obesity) ([place]) 12/19/2018  . Reflux     Patient Active Problem List   Diagnosis Date Noted  . Symptomatic anemia 12/19/2018  . GERD (gastroesophageal reflux disease) 12/19/2018  . Headache 12/19/2018  . Asthma 12/19/2018  . Low grade fever 12/19/2018  . Hypokalemia 12/19/2018  . Hypomagnesemia 12/19/2018  . Obesity, Class III, BMI 40-49.9 (morbid obesity) ([place]) 12/19/2018  . NEOPLASMS UNSPEC NATURE BONE SOFT TISSUE&SKIN 12/21/2009    Past Surgical History:  Procedure Laterality Date  . dilitation and cur    . ESOPHAGOGASTRODUODENOSCOPY   05/31/2011   Procedure: ESOPHAGOGASTRODUODENOSCOPY (EGD);  Surgeon: Daneil Dolin, MD;  Location: [facility];  Service: Endoscopy;  Laterality: N/A;     OB History   No obstetric history on file.      Home Medications    Prior to Admission medications   Medication Sig Start Date End Date Taking? Authorizing Provider  albuterol (PROVENTIL HFA;VENTOLIN HFA) 108 (90 BASE) MCG/ACT inhaler Inhale 2 puffs into the lungs every 6 (six) hours as needed. For shortness of breath    [provider]  ibuprofen (ADVIL,MOTRIN) 800 MG tablet Take 800 mg by mouth every 6 (six) hours as needed. For pain    [provider]  omeprazole (PRILOSEC) 40 MG capsule Take 40 mg by mouth daily.    [provider]    Family History History reviewed. No pertinent family history.  Social History Social History   Tobacco Use  . Smoking status: Never Smoker  . Smokeless tobacco: Never Used  Substance Use Topics  . Alcohol use: No  . Drug use: Not on file     Allergies   Vicodin [hydrocodone-acetaminophen]   Review of Systems Review of Systems  Constitutional: Positive for activity change and fatigue. Negative for fever.  Eyes: Positive for visual disturbance.  Respiratory: Negative for shortness of breath.   Cardiovascular: Negative for chest pain.  Gastrointestinal: Negative for vomiting.  Genitourinary: Positive for vaginal bleeding.  Allergic/Immunologic: Negative for immunocompromised state.  Neurological: Positive for dizziness, light-headedness and headaches. Negative for tremors, seizures, syncope, speech difficulty, weakness and numbness.  Hematological: Does not bruise/bleed easily.  All other systems reviewed and are negative.    Physical Exam Updated Vital Signs BP (!) 115/59   Pulse 99   Temp 98.5 F (36.9 C) (Oral)   Resp 16   Ht 5\' 2"  (1.575 m)   Wt 111.6 kg   LMP 12/14/2018   SpO2 99%   BMI 44.99 kg/m   Physical Exam Vitals signs and  nursing note reviewed.  Constitutional:      Appearance: She is well-developed.  HENT:     Head: Normocephalic and atraumatic.  Eyes:     General: No visual field deficit.    Extraocular Movements: Extraocular movements intact.     Right eye: No nystagmus.     Left eye: No nystagmus.     Pupils: Pupils are equal, round, and reactive to light.     Comments: Peripheral visual fields are normal  Neck:     Musculoskeletal: Normal range of motion and neck supple.     Comments: No meningismus or carotid bruit Cardiovascular:     Rate and Rhythm: Normal rate.  Pulmonary:     Effort: Pulmonary effort is normal.  Abdominal:     General: Bowel sounds are normal.  Skin:    General: Skin is warm and dry.  Neurological:     Mental Status: She is alert and oriented to person, place, and time.     GCS: GCS eye subscore is 4. GCS verbal subscore is 5. GCS motor subscore is 6.     Cranial Nerves: No cranial nerve deficit, dysarthria or facial asymmetry.     Sensory: No sensory deficit.     Motor: No weakness.     Coordination: Coordination normal.     Gait: Gait normal.      ED Treatments / Results  Labs (all labs ordered are listed, but only abnormal results are displayed) Labs Reviewed  COMPREHENSIVE METABOLIC PANEL - Abnormal; Notable for the following components:      Result Value   Sodium 134 (*)    Potassium 3.3 (*)    Glucose, Bld 128 (*)    Calcium 8.7 (*)    All other components within normal limits  CBC WITH DIFFERENTIAL/PLATELET - Abnormal; Notable for the following components:   RBC 3.35 (*)    Hemoglobin 6.5 (*)    HCT 22.7 (*)    MCV 67.8 (*)    MCH 19.4 (*)    MCHC 28.6 (*)    RDW 16.0 (*)    All other components within normal limits  URINALYSIS, ROUTINE W REFLEX MICROSCOPIC - Abnormal; Notable for the following components:   APPearance HAZY (*)    Hgb urine dipstick LARGE (*)    Protein, ur 30 (*)    Leukocytes,Ua SMALL (*)    RBC / HPF >50 (*)    All other  components within normal limits  MAGNESIUM - Abnormal; Notable for the following components:   Magnesium 1.6 (*)    All other components within normal limits  RETICULOCYTES - Abnormal; Notable for the following components:   RBC. 3.41 (*)    Immature Retic Fract 26.4 (*)    All other components within normal limits  SARS CORONAVIRUS 2 (HOSPITAL ORDER, [place] [facility])  PROTIME-INR  PREGNANCY, URINE  PHOSPHORUS  CBC  BASIC METABOLIC PANEL  VITAMIN B12  FOLATE  IRON AND TIBC  FERRITIN  TYPE AND SCREEN  PREPARE RBC (CROSSMATCH)  ABO/RH    EKG None  Radiology Ct Head Wo Contrast  Result Date: 12/19/2018 CLINICAL DATA:  Ataxia, stroke suspected, headache for 1 day and blurry vision for 2 weeks EXAM: CT HEAD WITHOUT CONTRAST TECHNIQUE: Contiguous axial images were obtained from the base of the skull through the vertex without intravenous contrast. COMPARISON:  None. FINDINGS: Brain: No evidence of acute infarction, hemorrhage, hydrocephalus, extra-axial collection or mass lesion/mass effect. Vascular: No hyperdense vessel or unexpected calcification. Skull: No calvarial fracture or suspicious osseous lesion. No scalp swelling or hematoma. Sinuses/Orbits: Paranasal sinuses and mastoid air cells are predominantly clear. Included orbital structures are unremarkable. Other: None. IMPRESSION: Normal noncontrast head CT. Electronically Signed   By: Lovena Le M.D.   On: 12/19/2018 01:19    Procedures .Critical Care Performed by: Varney Biles, MD Authorized by: Varney Biles, MD   Critical care provider statement:    Critical care time (minutes):  45   Critical care was necessary to treat or prevent imminent or life-threatening deterioration of the following conditions:  Circulatory failure (symptomatic anemia)   Critical care was time spent personally by me on the following activities:  Discussions with consultants, evaluation of patient's response to  treatment, examination of patient, ordering and performing treatments and interventions, ordering and review of laboratory studies, ordering and review of radiographic studies, pulse oximetry, re-evaluation of patient's condition, obtaining history from patient or surrogate and review of old charts   (including critical care time)  Medications Ordered in ED Medications  LORazepam (ATIVAN) injection 1 mg (1 mg Intravenous Refused 12/18/18 2353)  0.9 %  sodium chloride infusion (has no administration in time range)  magnesium sulfate IVPB 2 g 50 mL (has no administration in time range)  potassium chloride SA (K-DUR) CR tablet 20 mEq (has no administration in time range)  acetaminophen (TYLENOL) tablet 650 mg (650 mg Oral Given 12/18/18 2116)  metoCLOPramide (REGLAN) injection 10 mg (10 mg Intravenous Given 12/18/18 2336)  sodium chloride 0.9 % bolus 500 mL (0 mLs Intravenous Stopped 12/19/18 0052)  acetaminophen (TYLENOL) tablet 650 mg (650 mg Oral Given 12/19/18 0249)     Initial Impression / Assessment and Plan / ED Course  I have reviewed the triage vital signs and the nursing notes.  Pertinent labs & imaging results that were available during my care of the patient were reviewed by me and considered in my medical decision making (see chart for details).  Clinical Course as of Dec 19 442  Fri Dec 19, 2018  0030 Hemoglobin is 6.5.  This is result of her menorrhagia.  I suspect that patient's dizziness is likely because of this anemia.  I went in and discussed these findings with the patient.  She has consented for transfusion.  We further discussed with her that in the setting of her having severe blood loss anemia, her dizziness is likely secondary to it rather than a primary neurologic disorder including infection.  That being said, I informed patient that she has a low-grade fever that I cannot explain.  We discussed with her that with her having headaches and low-grade fever, we can proceed with  LP if she would like, however my suspicion for underlying infection or subarachnoid hemorrhage is low given that she is immunocompetent and has no high risk features for either brain aneurysm or brain bleed or brain infection.  Patient agrees.  CT scan  is still pending.  I will talk to her 1 more time after the CT scan to see if she is okay for admission without getting LP.  Hemoglobin(!!): 6.5 [AN]  0220 Results of the CT scan are normal.  Patient now with her husband.  She is comfortable with not getting LP.  We will admit her to medicine service.  If patient continues to have dizziness despite blood transfusion, then she might need CTA or MRA with MRI brain to rule out stroke or any other vascular pathology.  LP only if patient starts having worsening headaches or altered mental status.  CT Head Wo Contrast [AN]    Clinical Course User Index [AN] Varney Biles, MD       38 year old female comes in a chief complaint of headache and dizziness.  She is noted to have a low-grade fever and tachycardia.  She also indicates that she is been having menorrhagia for the last several weeks.  With a low-grade fever, dizziness, headache we considered meningitis in the differential diagnosis.  Patient has no meningismus however and she is not immunocompromised, immunocompetent or have any risk for meningitis.  Suspicion for meningitis is quite low, but if her work-up is entirely normal then we will discuss with her LP.  Additionally, headache with some neurologic deficits includes brain aneurysm, brain bleed in the differential diagnosis.  Vertebral artery dissection also considered in the differential.  Patient states that her grandfather has brain aneurysm, with no other family members have the same.  No trauma.  CT head ordered right now.  Will consider further imaging at time of reassessment.   Finally, patient is having menorrhagia.  Her symptoms of dizziness could be because of symptomatic anemia.   Type and screen sent along with CBC.  Other possibilities include stroke, orthostatic hypotension.  Final Clinical Impressions(s) / ED Diagnoses   Final diagnoses:  Symptomatic anemia  Acute nonintractable headache, unspecified headache type  Fever, unspecified fever cause    ED Discharge Orders    None           Varney Biles, MD 12/19/18 [phone]

## 2018-12-19 NOTE — H&P (Signed)
History and Physical    Sydney Stevens [id number] DOB: [birth year] DOA: 12/18/2018  PCP: Lemmie Evens, MD   Patient coming from: Home.  I have personally briefly reviewed patient's old medical records in [place]  Chief Complaint: Headache and blurred vision.  HPI: Sydney Stevens is a 38 y.o. female with medical history significant of asthma, GERD, who is coming to the emergency department due to headache since yesterday and vision disturbances for the past 2 weeks.  The patient states visual disturbances often occur with changes in position or if she turns her head she feels like her vision is darkening.  She also complains of hearing her heartbeat in her ears.  She has had some palpitations and mild positional dizziness.  She has been feeling with less energy, somnolent during the day despite sleeping more lately.  She complains of mild sore throat, but no rhinorrhea, productive cough, wheezing or hemoptysis.  She denies dyspnea, chest pain, lower extremity edema, abdominal pain, nausea or vomiting, diarrhea, constipation, melena or hematochezia.  No dysuria, frequency or hematuria.  No polyuria, polydipsia, polyphagia.  ED Course: Temperature 100.8 F, pulse 125, respirations 18, blood pressure 144/72 mmHg and O2 sat 100% on room air.  The patient was given a 500 mL NS bolus, 650 mg of acetaminophen and 10 mg of metoclopramide IVP x1.  Her urinalysis was hazy with large hemoglobinuria, proteinuria 30 mg/dL and small leukocyte esterase.  Microscopic was significant for more than 50 RBC per hpf.  White count is 8.5 with 82% neutrophils, 10% lymphocytes and 7% monocytes.  Hemoglobin was 6.5 g/dL and platelets 282.  PT was 13.5 and INR 1.0.  Urine pregnancy test was negative.  SARS coronavirus 2 swab was negative.  CMP shows a sodium 134 and potassium of 3.3 mmol/L.  Glucose 128 and calcium 8.7 mg/dL.  LFTs and renal function are normal.  Phosphorus 3.4 magnesium 1.6 mg/dL.  Review of  Systems: As per HPI otherwise 10 point review of systems negative.   Past Medical History:  Diagnosis Date  . Asthma   . Reflux     Past Surgical History:  Procedure Laterality Date  . dilitation and cur    . ESOPHAGOGASTRODUODENOSCOPY  05/31/2011   Procedure: ESOPHAGOGASTRODUODENOSCOPY (EGD);  Surgeon: Daneil Dolin, MD;  Location: [facility];  Service: Endoscopy;  Laterality: N/A;     reports that she has never smoked. She has never used smokeless tobacco. She reports that she does not drink alcohol. No history on file for drug.  Allergies  Allergen Reactions  . Vicodin [Hydrocodone-Acetaminophen]     nausea   Family medical history Father: Hypertension and kidney disease. Brother: Hypertension.   Prior to Admission medications   Medication Sig Start Date End Date Taking? Authorizing Provider  albuterol (PROVENTIL HFA;VENTOLIN HFA) 108 (90 BASE) MCG/ACT inhaler Inhale 2 puffs into the lungs every 6 (six) hours as needed. For shortness of breath    [provider]  ibuprofen (ADVIL,MOTRIN) 800 MG tablet Take 800 mg by mouth every 6 (six) hours as needed. For pain    [provider]  omeprazole (PRILOSEC) 40 MG capsule Take 40 mg by mouth daily.    [provider]    Physical Exam: Vitals:   12/18/18 2247 12/18/18 2330 12/19/18 0000 12/19/18 0009  BP:  122/82 129/72 (!) 127/49  Pulse: (!) 109 (!) 109 99 (!) 109  Resp:    14  Temp: (!) 100.5 F (38.1 C)  TempSrc: Oral     SpO2: 97% 98% 95% 96%  Weight:      Height:        Constitutional: NAD, calm, comfortable Eyes: PERRL, lids and conjunctivae are pale. ENMT: Mucous membranes are moist. Posterior pharynx is mildly erythematosus. Neck: normal, supple, no masses, no thyromegaly Respiratory: clear to auscultation bilaterally, no wheezing, no crackles. Normal respiratory effort. No accessory muscle use.  Cardiovascular: Regular rate and rhythm, no murmurs / rubs / gallops. No extremity  edema. 2+ pedal pulses. No carotid bruits.  Abdomen: Obese, nondistended. Bowel sounds positive.  Soft, no tenderness, no masses palpated. No hepatosplenomegaly.  Musculoskeletal: no clubbing / cyanosis. Good ROM, no contractures. Normal muscle tone.  Skin: no rashes, lesions, ulcers on limited dermatological examination. Neurologic: CN 2-12 grossly intact. Sensation intact, DTR normal. Strength 5/5 in all 4.  Psychiatric: Normal judgment and insight. Alert and oriented x 3. Normal mood.   Labs on Admission: I have personally reviewed following labs and imaging studies  CBC: Recent Labs  Lab 12/18/18 2331  WBC 8.5  NEUTROABS 7.0  HGB 6.5*  HCT 22.7*  MCV 67.8*  PLT [id number]   Basic Metabolic Panel: Recent Labs  Lab 12/18/18 2331  NA 134*  K 3.3*  CL 103  CO2 22  GLUCOSE 128*  BUN 8  CREATININE 0.61  CALCIUM 8.7*   GFR: Estimated Creatinine Clearance: 111.3 mL/min (by C-G formula based on SCr of 0.61 mg/dL). Liver Function Tests: Recent Labs  Lab 12/18/18 2331  AST 19  ALT 20  ALKPHOS 47  BILITOT 0.5  PROT 7.3  ALBUMIN 4.1   No results for input(s): LIPASE, AMYLASE in the last 168 hours. No results for input(s): AMMONIA in the last 168 hours. Coagulation Profile: Recent Labs  Lab 12/18/18 2331  INR 1.0   Cardiac Enzymes: No results for input(s): CKTOTAL, CKMB, CKMBINDEX, TROPONINI in the last 168 hours. BNP (last 3 results) No results for input(s): PROBNP in the last 8760 hours. HbA1C: No results for input(s): HGBA1C in the last 72 hours. CBG: No results for input(s): GLUCAP in the last 168 hours. Lipid Profile: No results for input(s): CHOL, HDL, LDLCALC, TRIG, CHOLHDL, LDLDIRECT in the last 72 hours. Thyroid Function Tests: No results for input(s): TSH, T4TOTAL, FREET4, T3FREE, THYROIDAB in the last 72 hours. Anemia Panel: No results for input(s): VITAMINB12, FOLATE, FERRITIN, TIBC, IRON, RETICCTPCT in the last 72 hours. Urine analysis:    Component  Value Date/Time   COLORURINE YELLOW 12/19/2018 0015   APPEARANCEUR HAZY (A) 12/19/2018 0015   LABSPEC 1.019 12/19/2018 0015   PHURINE 5.0 12/19/2018 0015   GLUCOSEU NEGATIVE 12/19/2018 0015   HGBUR LARGE (A) 12/19/2018 0015   BILIRUBINUR NEGATIVE 12/19/2018 0015   KETONESUR NEGATIVE 12/19/2018 0015   PROTEINUR 30 (A) 12/19/2018 0015   NITRITE NEGATIVE 12/19/2018 0015   LEUKOCYTESUR SMALL (A) 12/19/2018 0015    Radiological Exams on Admission: Ct Head Wo Contrast  Result Date: 12/19/2018 CLINICAL DATA:  Ataxia, stroke suspected, headache for 1 day and blurry vision for 2 weeks EXAM: CT HEAD WITHOUT CONTRAST TECHNIQUE: Contiguous axial images were obtained from the base of the skull through the vertex without intravenous contrast. COMPARISON:  None. FINDINGS: Brain: No evidence of acute infarction, hemorrhage, hydrocephalus, extra-axial collection or mass lesion/mass effect. Vascular: No hyperdense vessel or unexpected calcification. Skull: No calvarial fracture or suspicious osseous lesion. No scalp swelling or hematoma. Sinuses/Orbits: Paranasal sinuses and mastoid air cells are predominantly clear. Included orbital structures are  unremarkable. Other: None. IMPRESSION: Normal noncontrast head CT. Electronically Signed   By: Lovena Le M.D.   On: 12/19/2018 01:19    EKG: Independently reviewed.    Assessment/Plan Principal Problem:   Symptomatic anemia Secondary to hypermenorrhea. 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 Continue PRBC transfusion started by Dr. Kathrynn Humble. Will get pelvic ultrasound. She was told to take daily iron supplements. Consider imaging or outpatient GYN evaluation.  Active Problems:   Low grade fever Has had some mild sore throat recently. SARS-2 swab was negative. Monitor temperature. Symptomatic treatment as needed.    GERD (gastroesophageal reflux disease) Continue PPI.    Headache CT head negative. Declined lumbar puncture.  Continue analgesics as needed.    Asthma Asymptomatic at this time. Albuterol MDI and O2 as needed.    Hypokalemia Replacing. Receiving blood transfusion. Magnesium has been supplemented Follow-up potassium level.    Hypomagnesemia Magnesium sulfate 2 g IVPB x1. Follow-up magnesium level as needed.    DVT prophylaxis: Lovenox SQ. Code Status: Full code. Family Communication: Disposition Plan: Observation for symptomatic anemia blood transfusion Low-grade fever monitoring and electrolyte replacement. Consults called: Admission status: Observation/telemetry.   Reubin Milan MD [facility]  If 7PM-7AM, please contact night-coverage [email or web address]  12/19/2018, 2:23 AM   This document was prepared using Dragon voice recognition software and may contain some unintended transcription errors.

## 2018-12-20 DIAGNOSIS — R51 Headache: Secondary | ICD-10-CM

## 2018-12-20 DIAGNOSIS — J039 Acute tonsillitis, unspecified: Secondary | ICD-10-CM

## 2018-12-20 DIAGNOSIS — N921 Excessive and frequent menstruation with irregular cycle: Secondary | ICD-10-CM

## 2018-12-20 DIAGNOSIS — N920 Excessive and frequent menstruation with regular cycle: Secondary | ICD-10-CM

## 2018-12-20 DIAGNOSIS — K219 Gastro-esophageal reflux disease without esophagitis: Secondary | ICD-10-CM

## 2018-12-20 DIAGNOSIS — J4520 Mild intermittent asthma, uncomplicated: Secondary | ICD-10-CM

## 2018-12-20 DIAGNOSIS — E876 Hypokalemia: Secondary | ICD-10-CM

## 2018-12-20 LAB — CBC
HCT: 27.1 % — ABNORMAL LOW (ref 36.0–46.0)
Hemoglobin: 7.8 g/dL — ABNORMAL LOW (ref 12.0–15.0)
MCH: 20.6 pg — ABNORMAL LOW (ref 26.0–34.0)
MCHC: 28.8 g/dL — ABNORMAL LOW (ref 30.0–36.0)
MCV: 71.7 fL — ABNORMAL LOW (ref 80.0–100.0)
Platelets: 281 10*3/uL (ref 150–400)
RBC: 3.78 MIL/uL — ABNORMAL LOW (ref 3.87–5.11)
RDW: 17.9 % — ABNORMAL HIGH (ref 11.5–15.5)
WBC: 11.6 10*3/uL — ABNORMAL HIGH (ref 4.0–10.5)
nRBC: 0 % (ref 0.0–0.2)

## 2018-12-20 MED ORDER — AMOXICILLIN-POT CLAVULANATE 875-125 MG PO TABS: 1.0000 | ORAL_TABLET | Freq: Two times a day (BID) | ORAL | 0 refills | Status: AC

## 2018-12-20 MED ORDER — PANTOPRAZOLE SODIUM 40 MG PO TBEC: 40.0000 mg | DELAYED_RELEASE_TABLET | Freq: Two times a day (BID) | ORAL | 1 refills | Status: AC

## 2018-12-20 MED ORDER — ALUM & MAG HYDROXIDE-SIMETH 200-200-20 MG/5ML PO SUSP
30.0000 mL | Freq: Once | ORAL | Status: AC
  Administered 2018-12-20: 30 mL via ORAL
  Filled 2018-12-20: qty 30

## 2018-12-20 MED ORDER — LIDOCAINE VISCOUS HCL 2 % MT SOLN
15.0000 mL | Freq: Once | OROMUCOSAL | Status: AC
  Administered 2018-12-20: 15 mL via ORAL
  Filled 2018-12-20: qty 15

## 2018-12-20 MED ORDER — DEXAMETHASONE 2 MG PO TABS: 2.0000 mg | ORAL_TABLET | Freq: Two times a day (BID) | ORAL | 0 refills | Status: AC

## 2018-12-20 MED ORDER — POLYSACCHARIDE IRON COMPLEX 150 MG PO CAPS: 150.0000 mg | ORAL_CAPSULE | Freq: Two times a day (BID) | ORAL | 2 refills | Status: AC

## 2018-12-20 MED ORDER — TOPIRAMATE 50 MG PO TABS: ORAL_TABLET | ORAL | 2 refills | Status: AC

## 2018-12-20 MED ORDER — CYANOCOBALAMIN 1000 MCG PO TABS: 1000.0000 ug | ORAL_TABLET | Freq: Every day | ORAL | 2 refills | Status: AC

## 2018-12-20 NOTE — Progress Notes (Signed)
Patient c/o sore throat unrelieved by chloraseptic spray and cepacol lozenges. Dr Dyann Kief made aware, Lidocaine and Mylanta ordered. Will continue to monitor.

## 2018-12-20 NOTE — Progress Notes (Signed)
IV and telemetry removed. D/C instructions reviewed with patient and family, verbalized understanding. Patient to be transported to private vehicle via wheelchair.

## 2018-12-20 NOTE — Discharge Summary (Signed)
Physician Discharge Summary  Sydney Stevens [id number] DOB: [birth year] DOA: 12/18/2018  PCP: Lemmie Evens, MD  Admit date: 12/18/2018 Discharge date: 12/20/2018  Time spent: 35 minutes  Recommendations for Outpatient Follow-up:  1. Repeat basic metabolic panel and magnesium level to follow electrolytes trend 2. Assess for complete resolution of tonsillitis infection. 3. Repeat CBC to follow hemoglobin trend. 4. Patient needs outpatient follow-up with gynecology service.   Discharge Diagnoses:  Acute blood loss/Symptomatic anemia GERD (gastroesophageal reflux disease) Headache Asthma Low grade fever Hypokalemia Hypomagnesemia Obesity, Class III, BMI 40-49.9 (morbid obesity) ([place]) Hypermenorrhea Tonsillitis   Discharge Condition: Stable and improved.  Patient discharged home with instruction to follow-up with PCP in 10 days.  Diet recommendation: Low calorie diet.  Filed Weights   12/18/18 2111  Weight: 111.6 kg    History of present illness:  As per H&P written by Dr. Olevia Bowens on 12/19/2018 38 y.o. female with medical history significant of asthma, GERD, who is coming to the emergency department due to headache since yesterday and vision disturbances for the past 2 weeks.  The patient states visual disturbances often occur with changes in position or if she turns her head she feels like her vision is darkening.  She also complains of hearing her heartbeat in her ears.  She has had some palpitations and mild positional dizziness.  She has been feeling with less energy, somnolent during the day despite sleeping more lately.  She complains of mild sore throat, but no rhinorrhea, productive cough, wheezing or hemoptysis.  She denies dyspnea, chest pain, lower extremity edema, abdominal pain, nausea or vomiting, diarrhea, constipation, melena or hematochezia.  No dysuria, frequency or hematuria.  No polyuria, polydipsia, polyphagia.  ED Course: Temperature 100.8 F, pulse 125,  respirations 18, blood pressure 144/72 mmHg and O2 sat 100% on room air.  The patient was given a 500 mL NS bolus, 650 mg of acetaminophen and 10 mg of metoclopramide IVP x1.  Her urinalysis was hazy with large hemoglobinuria, proteinuria 30 mg/dL and small leukocyte esterase.  Microscopic was significant for more than 50 RBC per hpf.  White count is 8.5 with 82% neutrophils, 10% lymphocytes and 7% monocytes.  Hemoglobin was 6.5 g/dL and platelets 282.  PT was 13.5 and INR 1.0.  Urine pregnancy test was negative.  SARS coronavirus 2 swab was negative.  CMP shows a sodium 134 and potassium of 3.3 mmol/L.  Glucose 128 and calcium 8.7 mg/dL.  LFTs and renal function are normal.  Phosphorus 3.4 magnesium 1.6 mg/dL.  Hospital Course:  1-acute blood loss anemia/symptomatic anemia -In the setting of hypermenorrhea -Patient received 2 units of PRBCs -Hemoglobin at discharge 7.8 -Patient was asymptomatic. -Patient also receive IV iron infusion and was discharged on Niferex and B12 supplementation at discharge. -Please repeat CBC to follow hemoglobin trend at follow-up visit -Will benefit of outpatient follow-up with gynecology service for further evaluation and treatment.    2-migraine/idiopathic intracranial hypertension. -Patient has been started on topiramate -Patient advised to lose weight  3-morbid obesity -Body mass index is 44.99 kg/m. -Low calorie diet, portion control and increase physical activity discussed with patient.  4-GERD -PPI  5-asthma -Stable -Continue albuterol as needed.  6-hypokalemia/hypomagnesemia -repleted -Follow basic metabolic panel follow-up visit to reassess electrolytes trend.  7-non-strep A tonsillitis -Rapid strep test was negative -Mild enlargement/erythema appreciated in her throat -Patient with low-grade temperature and elevated WBCs -5 days therapy with Augmentin and Decadron has been provided -Patient advised to maintain adequate  hydration.  Procedures:  See below for x-ray reports.  Consultations:  None  Discharge Exam: Vitals:   12/20/18 0546 12/20/18 1323  BP: 133/68 120/78  Pulse: 97 90  Resp: 17 20  Temp: 99.3 F (37.4 C) 99.6 F (37.6 C)  SpO2: 97% 99%    General: No headache, no chest pain, no shortness of breath, no nausea or vomiting.  Still complaining of some sore throat and mild intermittent visual disturbance.  No palpitations, no chest pain and is asking to be discharged home. Cardiovascular: S1 and S2, no rubs, no gallops, no murmurs. Respiratory: Clear to auscultation bilaterally, no using accessory muscles Abdomen: Obese, nontender, distended, positive bowel sounds Extremities: No edema, no cyanosis or clubbing.  Discharge Instructions   Discharge Instructions    Discharge instructions   Complete by: As directed    Take medications as prescribed Maintain adequate hydration Arrange follow-up with PCP in 10 days Arrange outpatient follow-up with gynecologist. Follow low-calorie diet, watch portion control and increase physical activity.     Allergies as of 12/20/2018      Reactions   Vicodin [hydrocodone-acetaminophen]    nausea      Medication List    STOP taking these medications   omeprazole 40 MG capsule Commonly known as: PRILOSEC Replaced by: pantoprazole 40 MG tablet     TAKE these medications   albuterol 108 (90 Base) MCG/ACT inhaler Commonly known as: VENTOLIN HFA Inhale 2 puffs into the lungs every 6 (six) hours as needed. For shortness of breath   amoxicillin-clavulanate 875-125 MG tablet Commonly known as: Augmentin Take 1 tablet by mouth 2 (two) times daily for 5 days.   cetirizine 10 MG tablet Commonly known as: ZYRTEC Take 1 tablet by mouth daily.   cyanocobalamin 1000 MCG tablet Take 1 tablet (1,000 mcg total) by mouth daily. Start taking on: December 21, 2018   dexamethasone 2 MG tablet Commonly known as: Decadron Take 1 tablet (2 mg  total) by mouth 2 (two) times daily for 5 days.   iron polysaccharides 150 MG capsule Commonly known as: NIFEREX Take 1 capsule (150 mg total) by mouth 2 (two) times daily.   pantoprazole 40 MG tablet Commonly known as: PROTONIX Take 1 tablet (40 mg total) by mouth 2 (two) times daily. Replaces: omeprazole 40 MG capsule   topiramate 50 MG tablet Commonly known as: TOPAMAX Take half tablet by mouth daily at bedtime for 6 days; then half tablet by mouth twice a day for 1 week; then half tablet by mouth in the morning and 1 tablet by mouth in the evening for 1 week and then 1 tablet by mouth twice a day.      Allergies  Allergen Reactions  . Vicodin [Hydrocodone-Acetaminophen]     nausea   Follow-up Information    Lemmie Evens, MD. Schedule an appointment as soon as possible for a visit in 10 day(s).   Specialty: Family Medicine Contact information: [place] [address] [phone]           The results of significant diagnostics from this hospitalization (including imaging, microbiology, ancillary and laboratory) are listed below for reference.    Significant Diagnostic Studies: Dg Chest 2 View  Result Date: 12/19/2018 CLINICAL DATA:  Left-sided chest pain radiating to the back. EXAM: CHEST - 2 VIEW COMPARISON:  Chest x-ray dated December 25, 2008. FINDINGS: The heart is at the upper limits of normal in size. Normal mediastinal contours. Normal pulmonary vascularity. No focal consolidation, pleural effusion, or pneumothorax.  No acute osseous abnormality. IMPRESSION: No active cardiopulmonary disease. Electronically Signed   By: Titus Dubin M.D.   On: 12/19/2018 09:40   Ct Head Wo Contrast  Result Date: 12/19/2018 CLINICAL DATA:  Ataxia, stroke suspected, headache for 1 day and blurry vision for 2 weeks EXAM: CT HEAD WITHOUT CONTRAST TECHNIQUE: Contiguous axial images were obtained from the base of the skull through the vertex without intravenous  contrast. COMPARISON:  None. FINDINGS: Brain: No evidence of acute infarction, hemorrhage, hydrocephalus, extra-axial collection or mass lesion/mass effect. Vascular: No hyperdense vessel or unexpected calcification. Skull: No calvarial fracture or suspicious osseous lesion. No scalp swelling or hematoma. Sinuses/Orbits: Paranasal sinuses and mastoid air cells are predominantly clear. Included orbital structures are unremarkable. Other: None. IMPRESSION: Normal noncontrast head CT. Electronically Signed   By: Lovena Le M.D.   On: 12/19/2018 01:19   US Pelvic Complete With Transvaginal  Result Date: 12/19/2018 CLINICAL DATA:  Hypermenorrhea, menorrhagia for months EXAM: TRANSABDOMINAL AND TRANSVAGINAL ULTRASOUND OF PELVIS TECHNIQUE: Both transabdominal and transvaginal ultrasound examinations of the pelvis were performed. Transabdominal technique was performed for global imaging of the pelvis including uterus, ovaries, adnexal regions, and pelvic cul-de-sac. It was necessary to proceed with endovaginal exam following the transabdominal exam to visualize the uterus, endometrium, and ovaries. COMPARISON:  None FINDINGS: Uterus Measurements: 9.2 x 4.7 x 6.4 cm = volume: 145 mL. Anteverted. Heterogeneous myometrium. Small cystic collection identified at the mid to lower uterus, 16 x 11 x 13 mm, question small degenerated leiomyoma, likely extending submucosal. No additional focal uterine masses Endometrium Thickness: 11 mm.  No endometrial fluid. Right ovary Measurements: 2.9 x 2.1 x 3.4 cm = volume: 10.7 mL. Suboptimally visualized due to bowel. No gross mass. Left ovary Measurements: 3.5 x 1.8 x 2.2 cm = volume: 6.9 mL. Normal morphology without mass Other findings No free pelvic fluid or adnexal masses. IMPRESSION: Probable small degenerated submucosal leiomyoma at mid to lower uterine segments, 16 mm greatest size. Otherwise unremarkable exam. Electronically Signed   By: Lavonia Dana M.D.   On: 12/19/2018 08:37     Microbiology: Recent Results (from the past 240 hour(s))  SARS Coronavirus 2 [facility] order, Performed in [facility] hospital lab) Nasopharyngeal Nasopharyngeal Swab     Status: None   Collection Time: 12/18/18 11:19 PM   Specimen: Nasopharyngeal Swab  Result Value Ref Range Status   SARS Coronavirus 2 NEGATIVE NEGATIVE Final    Comment: (NOTE) If result is NEGATIVE SARS-CoV-2 target nucleic acids are NOT DETECTED. The SARS-CoV-2 RNA is generally detectable in upper and lower  respiratory specimens during the acute phase of infection. The lowest  concentration of SARS-CoV-2 viral copies this assay can detect is 250  copies / mL. A negative result does not preclude SARS-CoV-2 infection  and should not be used as the sole basis for treatment or other  patient management decisions.  A negative result may occur with  improper specimen collection / handling, submission of specimen other  than nasopharyngeal swab, presence of viral mutation(s) within the  areas targeted by this assay, and inadequate number of viral copies  (<250 copies / mL). A negative result must be combined with clinical  observations, patient history, and epidemiological information. If result is POSITIVE SARS-CoV-2 target nucleic acids are DETECTED. The SARS-CoV-2 RNA is generally detectable in upper and lower  respiratory specimens dur ing the acute phase of infection.  Positive  results are indicative of active infection with SARS-CoV-2.  Clinical  correlation with patient  history and other diagnostic information is  necessary to determine patient infection status.  Positive results do  not rule out bacterial infection or co-infection with other viruses. If result is PRESUMPTIVE POSTIVE SARS-CoV-2 nucleic acids MAY BE PRESENT.   A presumptive positive result was obtained on the submitted specimen  and confirmed on repeat testing.  While 2019 novel coronavirus  (SARS-CoV-2) nucleic acids may be present in  the submitted sample  additional confirmatory testing may be necessary for epidemiological  and / or clinical management purposes  to differentiate between  SARS-CoV-2 and other Sarbecovirus currently known to infect humans.  If clinically indicated additional testing with an alternate test  methodology [phone]) is advised. The SARS-CoV-2 RNA is generally  detectable in upper and lower respiratory sp ecimens during the acute  phase of infection. The expected result is Negative. Fact Sheet for Patients:  StrictlyIdeas.no Fact Sheet for Healthcare Providers: BankingDealers.co.za This test is not yet approved or cleared by the [place] FDA and has been authorized for detection and/or diagnosis of SARS-CoV-2 by FDA under an Emergency Use Authorization (EUA).  This EUA will remain in effect (meaning this test can be used) for the duration of the COVID-19 declaration under Section 564(b)(1) of the Act, 21 U.S.C. section 360bbb-3(b)(1), unless the authorization is terminated or revoked sooner. Performed at [facility], [address], [place], [place] [address]   Group A Strep by PCR     Status: None   Collection Time: 12/19/18  8:43 AM   Specimen: Throat; Sterile Swab  Result Value Ref Range Status   Group A Strep by PCR NOT DETECTED NOT DETECTED Final    Comment: Performed at [facility], [address] [place], [place] [address]     Labs: Basic Metabolic Panel: Recent Labs  Lab 12/18/18 2331 12/19/18 0837  NA 134* 136  K 3.3* 3.5  CL 103 105  CO2 22 21*  GLUCOSE 128* 113*  BUN 8 6  CREATININE 0.61 0.61  CALCIUM 8.7* 8.7*  MG 1.6*  --   PHOS 3.4  --    Liver Function Tests: Recent Labs  Lab 12/18/18 2331  AST 19  ALT 20  ALKPHOS 47  BILITOT 0.5  PROT 7.3  ALBUMIN 4.1   CBC: Recent Labs  Lab 12/18/18 2331 12/19/18 0837 12/20/18 0619  WBC 8.5 9.5 11.6*  NEUTROABS 7.0  --   --   HGB 6.5* 8.1* 7.8*  HCT  22.7* 26.4* 27.1*  MCV 67.8* 68.0* 71.7*  PLT 282 262 281   Signed:  Barton Dubois MD.  [facility] 12/20/2018, 1:35 PM

## 2018-12-21 LAB — HIV ANTIBODY (ROUTINE TESTING W REFLEX): HIV Screen 4th Generation wRfx: NONREACTIVE

## 2018-12-22 LAB — TYPE AND SCREEN
ABO/RH(D): B POS
Antibody Screen: NEGATIVE
Unit division: 0
Unit division: 0

## 2018-12-22 LAB — BPAM RBC
Blood Product Expiration Date: 202009212359
Blood Product Expiration Date: 202009222359
ISSUE DATE / TIME: 202009040210
ISSUE DATE / TIME: 202009040446
Unit Type and Rh: 1700
Unit Type and Rh: 1700

## 2019-01-15 ENCOUNTER — Other Ambulatory Visit: Payer: Self-pay

## 2019-01-15 DIAGNOSIS — Z20822 Contact with and (suspected) exposure to covid-19: Secondary | ICD-10-CM

## 2019-01-16 LAB — NOVEL CORONAVIRUS, NAA: SARS-CoV-2, NAA: NOT DETECTED

## 2019-08-06 ENCOUNTER — Ambulatory Visit: Payer: Medicaid Other | Attending: Internal Medicine

## 2019-08-06 DIAGNOSIS — Z23 Encounter for immunization: Secondary | ICD-10-CM

## 2019-08-06 NOTE — Progress Notes (Signed)
   Covid-19 Vaccination Clinic  Name:  Sydney Stevens    MRN: [id number] DOB: January 01, 1981  08/06/2019  Ms. Sydney Stevens was observed post Covid-19 immunization for 15 minutes without incident. She was provided with Vaccine Information Sheet and instruction to access the V-Safe system.   Ms. Sydney Stevens was instructed to call 911 with any severe reactions post vaccine: Marland Kitchen Difficulty breathing  . Swelling of face and throat  . A fast heartbeat  . A bad rash all over body  . Dizziness and weakness   Immunizations Administered    Name Date Dose VIS Date Route   Moderna COVID-19 Vaccine 08/06/2019 10:59 AM 0.5 mL 03/2019 Intramuscular   Manufacturer: Moderna   Lot: [id number]   [id number]

## 2019-09-10 ENCOUNTER — Ambulatory Visit: Payer: Medicaid Other

## 2019-10-01 ENCOUNTER — Ambulatory Visit: Payer: Medicaid Other | Attending: Internal Medicine

## 2019-10-01 DIAGNOSIS — Z23 Encounter for immunization: Secondary | ICD-10-CM

## 2019-10-01 NOTE — Progress Notes (Signed)
   Covid-19 Vaccination Clinic  Name:  Sydney Stevens    MRN: [id number] DOB: 12/21/1980  10/01/2019  [name] was observed post Covid-19 immunization for 15 minutes without incident. She was provided with Vaccine Information Sheet and instruction to access the V-Safe system.   [name] was instructed to call 911 with any severe reactions post vaccine: Marland Kitchen Difficulty breathing  . Swelling of face and throat  . A fast heartbeat  . A bad rash all over body  . Dizziness and weakness   Immunizations Administered    Name Date Dose VIS Date Route   Moderna COVID-19 Vaccine 10/01/2019 11:34 AM 0.5 mL 03/2019 Intramuscular   Manufacturer: Moderna   Lot: [id number]   [place]: [id number]

## 2020-12-12 IMAGING — CT CT HEAD W/O CM
4 series · 16 of 47 positions shown, 18 images · non-contrast
Comparison: None.

CLINICAL DATA: Ataxia, stroke suspected, headache for 1 day and
blurry vision for 2 weeks

EXAM:
CT HEAD WITHOUT CONTRAST
TECHNIQUE: Contiguous axial images were obtained from the base of the skull
through the vertex without intravenous contrast.

[Series 2: head w o · axial · 0.46mm/px · z∈[+414,+514]mm · 7 of 28 slices shown, 9 images]
[im 4/28  brain]
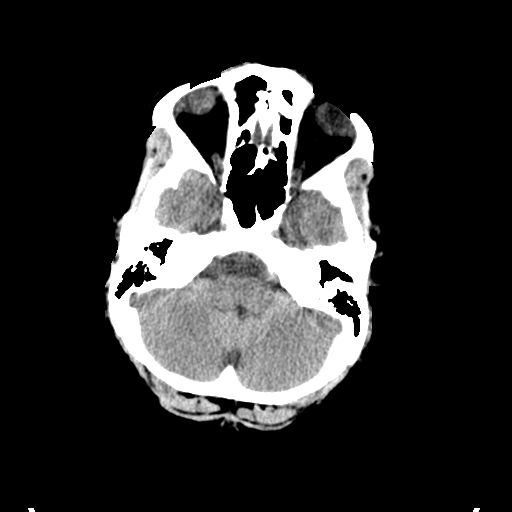
[im 4/28  bone]
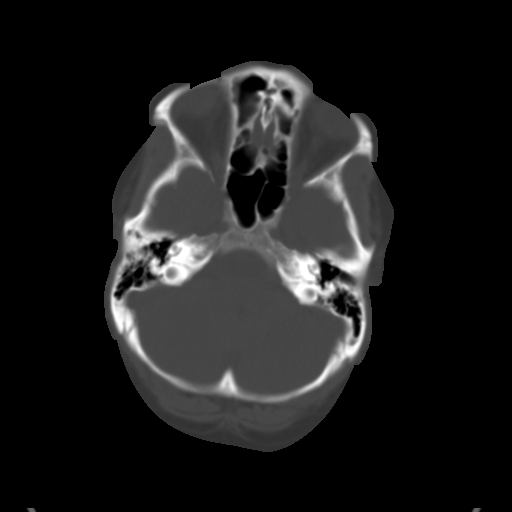
[im 7/28  brain]
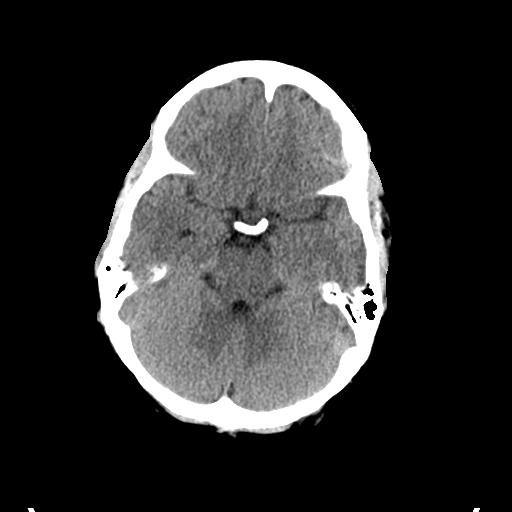
[im 11/28  brain]
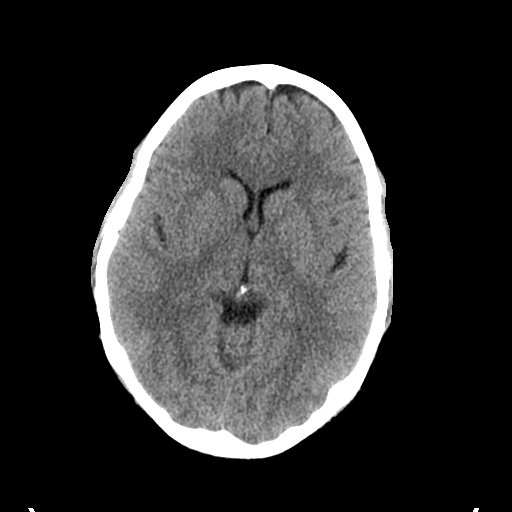
[im 14/28  brain]
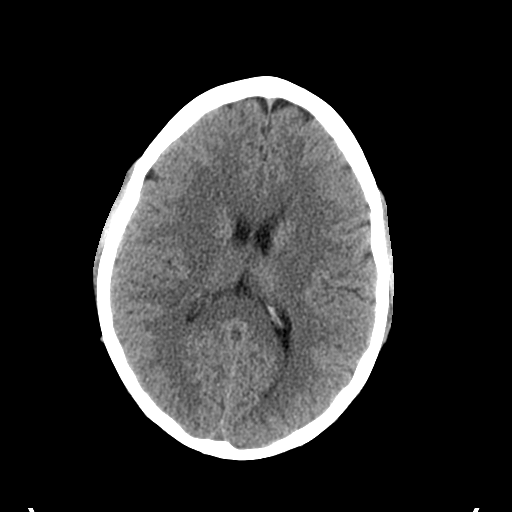
[im 17/28  brain]
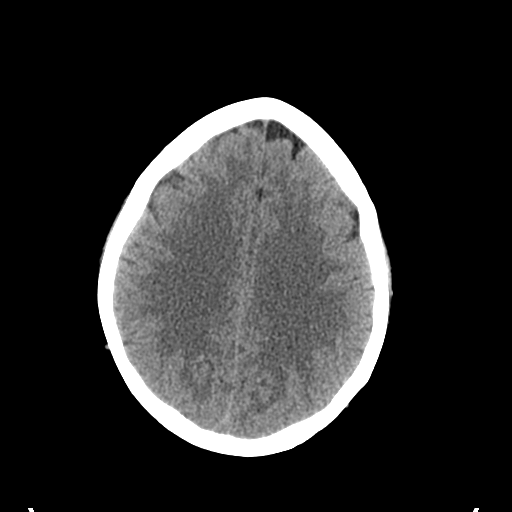
[im 17/28  bone]
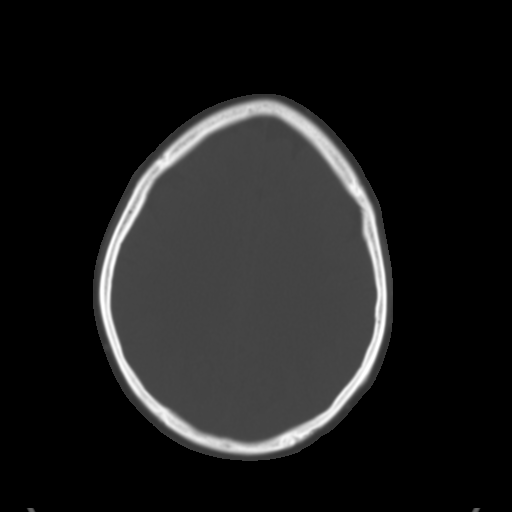
[im 21/28  brain]
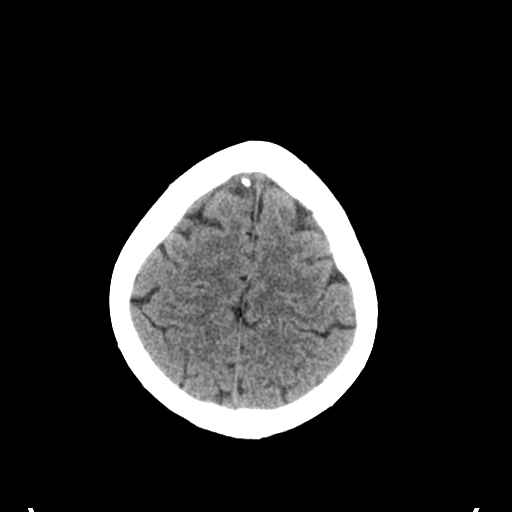
[im 24/28  brain]
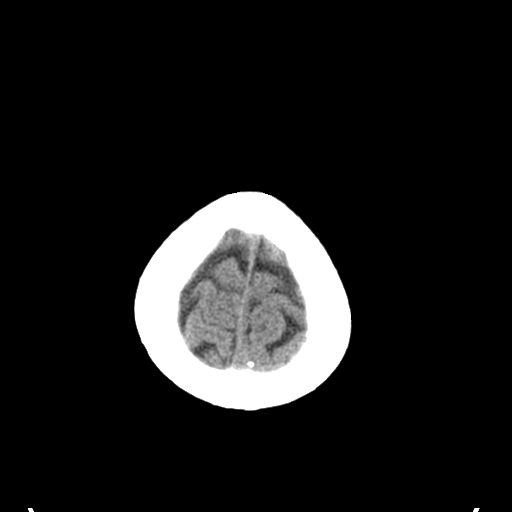

[Series 3: head bone · axial · 0.46mm/px · z∈[+411,+439]mm · 3 of 70 slices shown]
[im 7/70  bone]
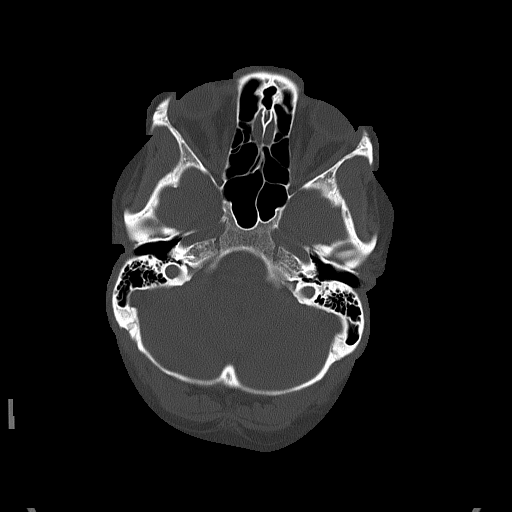
[im 14/70  bone]
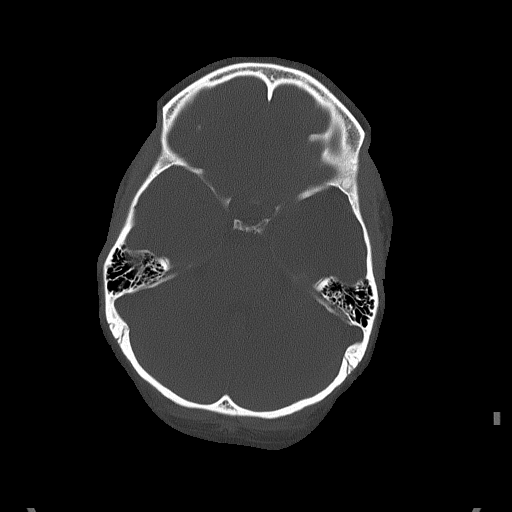
[im 21/70  bone]
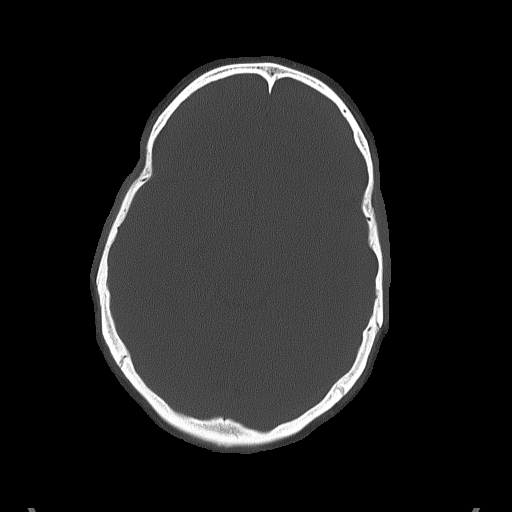

[Series 4: coronal soft · coronal · 0.30mm/px · 3 of 64 slices shown]
[im 22/64  brain]
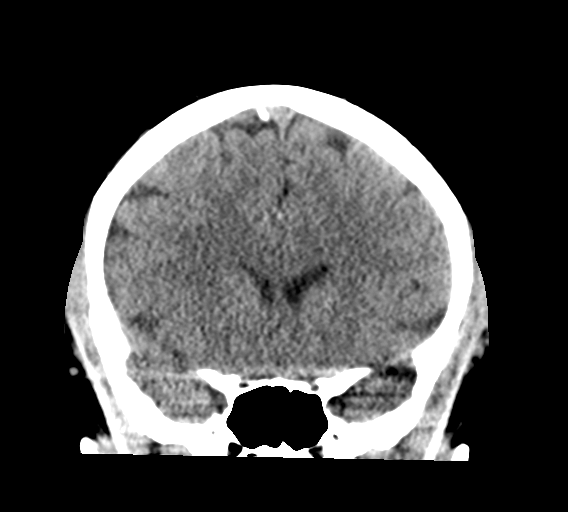
[im 29/64  brain]
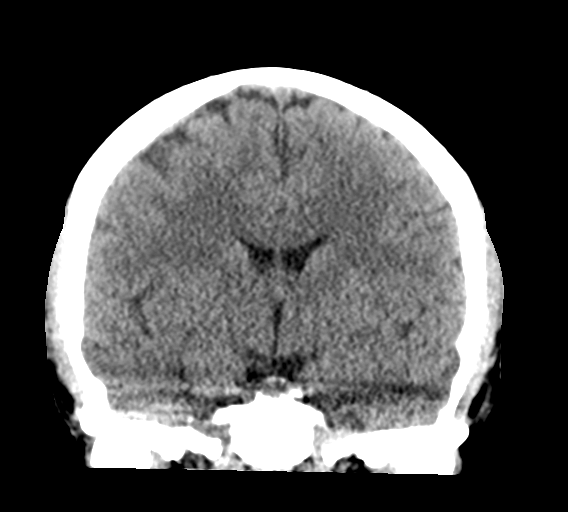
[im 36/64  brain]
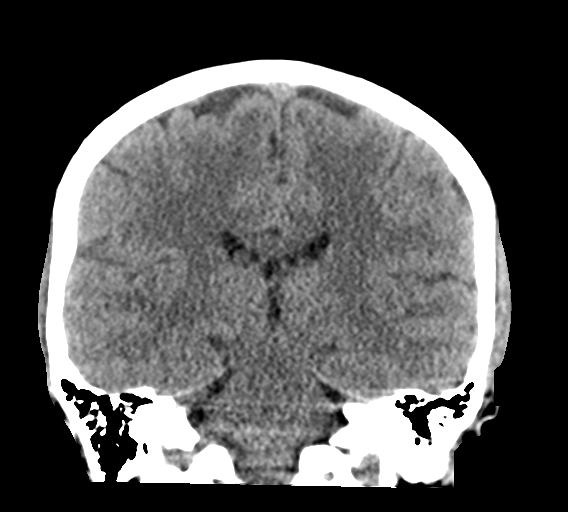

[Series 5: sagittal soft · sagittal · 0.30mm/px · 3 of 54 slices shown]
[im 18/54  brain]
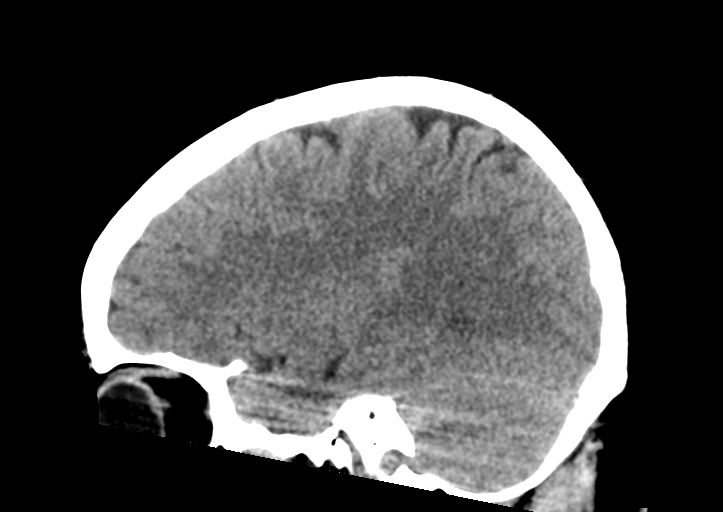
[im 27/54  brain]
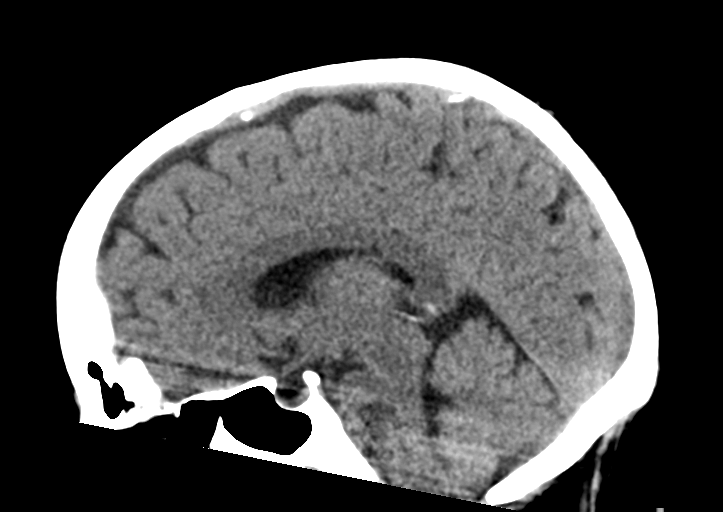
[im 36/54  brain]
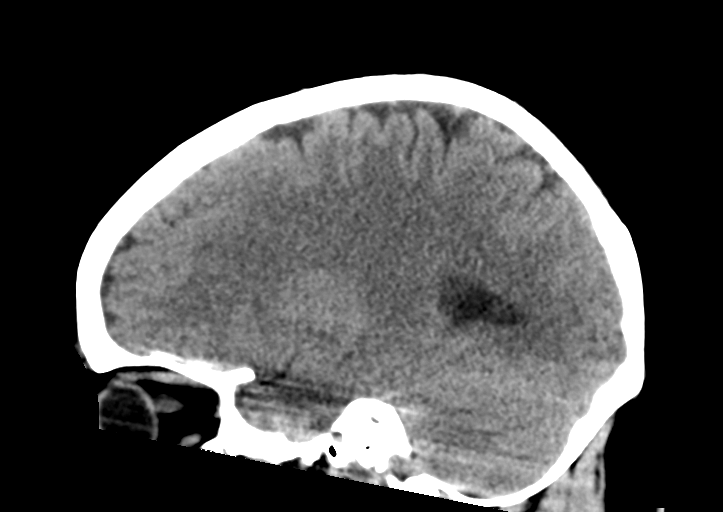

[16 of 47 positions shown; findings below may reference images not displayed]

FINDINGS: Brain: No evidence of acute infarction, hemorrhage, hydrocephalus,
extra-axial collection or mass lesion/mass effect.

Vascular: No hyperdense vessel or unexpected calcification.

Skull: No calvarial fracture or suspicious osseous lesion. No scalp
swelling or hematoma.

Sinuses/Orbits: Paranasal sinuses and mastoid air cells are
predominantly clear. Included orbital structures are unremarkable.

Other: None.
IMPRESSION: Normal noncontrast head CT.

## 2024-02-15 ENCOUNTER — Emergency Department (HOSPITAL_COMMUNITY)

## 2024-02-15 ENCOUNTER — Other Ambulatory Visit: Payer: Self-pay

## 2024-02-15 ENCOUNTER — Encounter (HOSPITAL_COMMUNITY): Payer: Self-pay | Admitting: Emergency Medicine

## 2024-02-15 ENCOUNTER — Emergency Department (HOSPITAL_COMMUNITY)
Admission: EM | Admit: 2024-02-15 | Discharge: 2024-02-15 | Disposition: A | Discharge status: Home / Self Care | Attending: Emergency Medicine | Admitting: Emergency Medicine

## 2024-02-15 DIAGNOSIS — X501XXA Overexertion from prolonged static or awkward postures, initial encounter: Secondary | ICD-10-CM | POA: Diagnosis not present

## 2024-02-15 DIAGNOSIS — M25571 Pain in right ankle and joints of right foot: Secondary | ICD-10-CM | POA: Diagnosis present

## 2024-02-15 DIAGNOSIS — Y9301 Activity, walking, marching and hiking: Secondary | ICD-10-CM | POA: Insufficient documentation

## 2024-02-15 DIAGNOSIS — S93401A Sprain of unspecified ligament of right ankle, initial encounter: Secondary | ICD-10-CM

## 2024-02-15 NOTE — ED Triage Notes (Signed)
 Pt wheeled into triage. Pt stated she was stepping out of a building and her right ankle went in a hole and rolled. Pt heard a loud pop. Endorses pain to right ankle. Right ankle appears to be swollen.

## 2024-02-15 NOTE — Discharge Instructions (Signed)
 Please follow-up closely with orthopedics for any continued symptoms.  Return to emergency department immediately for any new or worsening symptoms.

## 2024-02-15 NOTE — ED Provider Notes (Signed)
 [place] EMERGENCY DEPARTMENT AT [facility] Provider Note   CSN: [id number] Arrival date & time: 02/15/24  1309     Patient presents with: Ankle Pain and Ankle Injury   Sydney Stevens is a 43 y.o. female.   Patient is a 43 year old female who presents emergency department the chief complaint of pain to the right ankle.  Patient notes that she stepped in a hole when she was walking out of a building and twisting her ankle.  She denies any previous injuries or surgeries to the affected extremity.  She denies any other long bone or joint pain.  She denies any numbness or paresthesias.  She denies any injuries to her head, neck, back.   Ankle Pain Ankle Injury       Prior to Admission medications   Medication Sig Start Date End Date Taking? Authorizing Provider  albuterol  (PROVENTIL  HFA;VENTOLIN  HFA) 108 (90 BASE) MCG/ACT inhaler Inhale 2 puffs into the lungs every 6 (six) hours as needed. For shortness of breath    [provider]  cetirizine (ZYRTEC) 10 MG tablet Take 1 tablet by mouth daily.    [provider]  iron  polysaccharides (NIFEREX) 150 MG capsule Take 1 capsule (150 mg total) by mouth 2 (two) times daily. 12/20/18   Ricky Fines, MD  pantoprazole  (PROTONIX ) 40 MG tablet Take 1 tablet (40 mg total) by mouth 2 (two) times daily. 12/20/18   Ricky Fines, MD  topiramate  (TOPAMAX ) 50 MG tablet Take half tablet by mouth daily at bedtime for 6 days; then half tablet by mouth twice a day for 1 week; then half tablet by mouth in the morning and 1 tablet by mouth in the evening for 1 week and then 1 tablet by mouth twice a day. 12/20/18   Ricky Fines, MD  vitamin B-12 1000 MCG tablet Take 1 tablet (1,000 mcg total) by mouth daily. 12/21/18   Ricky Fines, MD    Allergies: Vicodin [hydrocodone-acetaminophen ]    Review of Systems  Musculoskeletal:        Right ankle pain  All other systems reviewed and are negative.   Updated Vital Signs BP (!)  177/77 (BP Location: Left Arm)   Pulse 86   Temp 98 F (36.7 C) (Oral)   Resp 19   Ht 5' 5 (1.651 m)   Wt 113.4 kg   LMP 02/06/2024 (Approximate)   SpO2 100%   BMI 41.60 kg/m   Physical Exam Vitals and nursing note reviewed.  Constitutional:      General: She is not in acute distress.    Appearance: Normal appearance. She is not ill-appearing.  HENT:     Head: Normocephalic and atraumatic.  Eyes:     Extraocular Movements: Extraocular movements intact.     Conjunctiva/sclera: Conjunctivae normal.     Pupils: Pupils are equal, round, and reactive to light.  Cardiovascular:     Rate and Rhythm: Normal rate and regular rhythm.     Pulses: Normal pulses.  Pulmonary:     Effort: Pulmonary effort is normal. No respiratory distress.  Musculoskeletal:        General: Normal range of motion.     Cervical back: Normal range of motion and neck supple.     Comments: Tender to palpation along the lateral aspect of the right ankle, nontender palpation of her right foot, knee or hip diffusely, edema noted over the right ankle, DP and PT pulses are 2+ distally, sensation intact distally, limited active range  of motion at the right ankle secondary to pain, no obvious deformity, no skin breakdown or ulceration, no lacerations or abrasions  Skin:    General: Skin is warm and dry.  Neurological:     General: No focal deficit present.     Mental Status: She is alert and oriented to person, place, and time. Mental status is at baseline.  Psychiatric:        Mood and Affect: Mood normal.        Behavior: Behavior normal.        Thought Content: Thought content normal.        Judgment: Judgment normal.     (all labs ordered are listed, but only abnormal results are displayed) Labs Reviewed - No data to display  EKG: None  Radiology: No results found.   Procedures   Medications Ordered in the ED - No data to display                                  Medical Decision  Making Patient is doing well at this time and is stable for discharge home.  Discussed with patient that x-rays demonstrated no signs of acute osseous injury or lesions.  She was nontender palpation over the knee or proximal fibula.  She was neurovascularly intact distally.  Suspect ankle sprain at this time.  Will provide Velcro ankle splint and crutches and recommend close follow-up with orthopedics for any continued symptoms.  Strict turn precautions were provided for any new or worsening symptoms.  Patient voiced understanding and had no additional questions.  She had no other secondary sites of injury or pain on exam.  Amount and/or Complexity of Data Reviewed Radiology: ordered.        Final diagnoses:  None    ED Discharge Orders     None          Daralene Lonni BIRCH, PA-C 02/15/24 1427    Charlyn Sora, MD 02/16/24 [phone]
